# Patient Record
Sex: Female | Born: 1980 | Race: Black or African American | Hispanic: No | Smoking: Never smoker
Health system: Southern US, Community
[De-identification: ages and names within clinical notes are randomized; demographics above are authoritative.]

## PROBLEM LIST (undated history)

## (undated) ENCOUNTER — Inpatient Hospital Stay (HOSPITAL_COMMUNITY): Payer: Self-pay

## (undated) DIAGNOSIS — I2699 Other pulmonary embolism without acute cor pulmonale: Secondary | ICD-10-CM

## (undated) DIAGNOSIS — D573 Sickle-cell trait: Secondary | ICD-10-CM

## (undated) DIAGNOSIS — D649 Anemia, unspecified: Secondary | ICD-10-CM

## (undated) DIAGNOSIS — J45909 Unspecified asthma, uncomplicated: Secondary | ICD-10-CM

## (undated) HISTORY — PX: NO PAST SURGERIES: SHX2092

---

## 2005-08-27 ENCOUNTER — Emergency Department (HOSPITAL_COMMUNITY): Admission: EM | Admit: 2005-08-27 | Discharge: 2005-08-27 | Payer: Self-pay | Admitting: Emergency Medicine

## 2005-08-30 ENCOUNTER — Emergency Department (HOSPITAL_COMMUNITY): Admission: EM | Admit: 2005-08-30 | Discharge: 2005-08-30 | Payer: Self-pay | Admitting: Family Medicine

## 2006-02-18 ENCOUNTER — Emergency Department (HOSPITAL_COMMUNITY): Admission: EM | Admit: 2006-02-18 | Discharge: 2006-02-18 | Payer: Self-pay | Admitting: Emergency Medicine

## 2008-05-22 DIAGNOSIS — Z86718 Personal history of other venous thrombosis and embolism: Secondary | ICD-10-CM | POA: Insufficient documentation

## 2009-10-15 ENCOUNTER — Emergency Department (HOSPITAL_COMMUNITY): Admission: EM | Admit: 2009-10-15 | Discharge: 2009-10-15 | Payer: Self-pay | Admitting: Emergency Medicine

## 2010-08-08 LAB — D-DIMER, QUANTITATIVE: D-Dimer, Quant: 0.31 ug/mL-FEU (ref 0.00–0.48)

## 2010-08-08 LAB — DIFFERENTIAL
Basophils Absolute: 0 10*3/uL (ref 0.0–0.1)
Basophils Relative: 0 % (ref 0–1)
Eosinophils Absolute: 0.1 10*3/uL (ref 0.0–0.7)
Eosinophils Relative: 2 % (ref 0–5)
Neutro Abs: 3.8 10*3/uL (ref 1.7–7.7)

## 2010-08-08 LAB — PROTIME-INR
INR: 1.13 (ref 0.00–1.49)
Prothrombin Time: 14.4 seconds (ref 11.6–15.2)

## 2010-08-08 LAB — BASIC METABOLIC PANEL
BUN: 10 mg/dL (ref 6–23)
CO2: 25 mEq/L (ref 19–32)
Creatinine, Ser: 0.69 mg/dL (ref 0.4–1.2)
GFR calc Af Amer: >60 mL/min (ref >60)
Glucose, Bld: 99 mg/dL (ref 70–99)
Sodium: 139 mEq/L (ref 135–145)

## 2010-08-08 LAB — CBC
HCT: 38.4 % (ref 36.0–46.0)
MCV: 93.6 fL (ref 78.0–100.0)
WBC: 6.2 10*3/uL (ref 4.0–10.5)

## 2010-10-01 ENCOUNTER — Emergency Department (HOSPITAL_COMMUNITY): Payer: Self-pay

## 2010-10-01 ENCOUNTER — Emergency Department (HOSPITAL_COMMUNITY)
Admission: EM | Admit: 2010-10-01 | Discharge: 2010-10-02 | Disposition: A | Discharge status: Home / Self Care | Payer: Self-pay | Attending: Emergency Medicine | Admitting: Emergency Medicine

## 2010-10-01 DIAGNOSIS — R079 Chest pain, unspecified: Secondary | ICD-10-CM | POA: Insufficient documentation

## 2010-10-01 DIAGNOSIS — R0602 Shortness of breath: Secondary | ICD-10-CM | POA: Insufficient documentation

## 2010-10-01 DIAGNOSIS — M546 Pain in thoracic spine: Secondary | ICD-10-CM | POA: Insufficient documentation

## 2010-10-01 DIAGNOSIS — Z86718 Personal history of other venous thrombosis and embolism: Secondary | ICD-10-CM | POA: Insufficient documentation

## 2010-10-01 HISTORY — DX: Other pulmonary embolism without acute cor pulmonale: I26.99

## 2010-10-01 LAB — DIFFERENTIAL
Basophils Absolute: 0 10*3/uL (ref 0.0–0.1)
Lymphocytes Relative: 32 % (ref 12–46)
Monocytes Absolute: 0.8 10*3/uL (ref 0.1–1.0)
Neutro Abs: 3.7 10*3/uL (ref 1.7–7.7)

## 2010-10-01 LAB — POCT I-STAT, CHEM 8
BUN: 12 mg/dL (ref 6–23)
Calcium, Ion: 1.11 mmol/L — ABNORMAL LOW (ref 1.12–1.32)
HCT: 38 % (ref 36.0–46.0)
Potassium: 3.6 mEq/L (ref 3.5–5.1)
Sodium: 141 mEq/L (ref 135–145)

## 2010-10-01 LAB — CBC
HCT: 35.7 % — ABNORMAL LOW (ref 36.0–46.0)
MCH: 31.1 pg (ref 26.0–34.0)
MCHC: 34.5 g/dL (ref 30.0–36.0)
MCV: 90.4 fL (ref 78.0–100.0)
RBC: 3.95 MIL/uL (ref 3.87–5.11)
WBC: 7.1 10*3/uL (ref 4.0–10.5)

## 2010-10-02 ENCOUNTER — Encounter (HOSPITAL_COMMUNITY): Payer: Self-pay | Admitting: Radiology

## 2010-10-02 MED ORDER — IOHEXOL 300 MG/ML  SOLN
100.0000 mL | Freq: Once | INTRAMUSCULAR | Status: AC | PRN
  Administered 2010-10-02: 100 mL via INTRAVENOUS

## 2011-07-26 ENCOUNTER — Emergency Department (HOSPITAL_COMMUNITY)
Admission: EM | Admit: 2011-07-26 | Discharge: 2011-07-26 | Disposition: A | Discharge status: Home / Self Care | Payer: Self-pay | Attending: Emergency Medicine | Admitting: Emergency Medicine

## 2011-07-26 ENCOUNTER — Emergency Department (HOSPITAL_COMMUNITY): Payer: Self-pay

## 2011-07-26 ENCOUNTER — Encounter (HOSPITAL_COMMUNITY): Payer: Self-pay | Admitting: Emergency Medicine

## 2011-07-26 DIAGNOSIS — Z86711 Personal history of pulmonary embolism: Secondary | ICD-10-CM | POA: Insufficient documentation

## 2011-07-26 DIAGNOSIS — R209 Unspecified disturbances of skin sensation: Secondary | ICD-10-CM | POA: Insufficient documentation

## 2011-07-26 DIAGNOSIS — M542 Cervicalgia: Secondary | ICD-10-CM | POA: Insufficient documentation

## 2011-07-26 LAB — POCT I-STAT, CHEM 8
Calcium, Ion: 1.21 mmol/L (ref 1.12–1.32)
Chloride: 109 mEq/L (ref 96–112)
Creatinine, Ser: 0.6 mg/dL (ref 0.50–1.10)
HCT: 40 % (ref 36.0–46.0)
Potassium: 3.8 mEq/L (ref 3.5–5.1)
TCO2: 23 mmol/L (ref 0–100)

## 2011-07-26 MED ORDER — IBUPROFEN 600 MG PO TABS: 600.0000 mg | ORAL_TABLET | Freq: Four times a day (QID) | ORAL | Status: AC | PRN

## 2011-07-26 MED ORDER — DIAZEPAM 5 MG PO TABS: 5.0000 mg | ORAL_TABLET | Freq: Four times a day (QID) | ORAL | Status: AC | PRN

## 2011-07-26 NOTE — Discharge Instructions (Signed)
Called a neurologist to schedule an appointment to be seen. Return here at once for inability to move your arms, severe pain, or any other problems. Take medications as directed

## 2011-07-26 NOTE — ED Provider Notes (Signed)
History     CSN: [id number]  Arrival date & time 07/26/11  1826   First MD Initiated Contact with Patient 07/26/11 1915      Chief Complaint  Patient presents with  . Numbness  . Neck Pain    (Consider location/radiation/quality/duration/timing/severity/associated sxs/prior treatment) Patient is a 31 y.o. female presenting with neck pain. The history is provided by the patient.  Neck Pain    patient presents with numbness in the back of her head radiating down both shoulders into her arms for one month. Symptoms are not associated with blurred vision, slurred speech. Nothing seems to make her symptoms better or worse. No dizziness or neck injury. No medications taken for this prior to arrival. She notes and paresthesias of the tips of both fingers. No prior evaluation for this. She does note a remote history of pulmonary embolism but denies any new dyspnea, chest pain, leg pain or swelling.  Past Medical History  Diagnosis Date  . PE (pulmonary embolism)     History reviewed. No pertinent past surgical history.  History reviewed. No pertinent family history.  History  Substance Use Topics  . Smoking status: Never Smoker   . Smokeless tobacco: Not on file  . Alcohol Use: No    OB History    Grav Para Term Preterm Abortions TAB SAB Ect Mult Living                  Review of Systems  HENT: Positive for neck pain.   All other systems reviewed and are negative.    Allergies  Review of patient's allergies indicates no known allergies.  Home Medications  No current outpatient prescriptions on file.  BP 123/67  Pulse 89  Temp(Src) 98.5 F (36.9 C) (Oral)  Resp 16  SpO2 99%  LMP 07/26/2011  Physical Exam  Nursing note and vitals reviewed. Constitutional: She is oriented to person, place, and time. She appears well-developed and well-nourished.  Non-toxic appearance. No distress.  HENT:  Head: Normocephalic and atraumatic.  Eyes: Conjunctivae, EOM and lids are  normal. Pupils are equal, round, and reactive to light.  Neck: Normal range of motion. Neck supple. No tracheal deviation present. No mass present.  Cardiovascular: Normal rate, regular rhythm and normal heart sounds.  Exam reveals no gallop.   No murmur heard. Pulmonary/Chest: Effort normal and breath sounds normal. No stridor. No respiratory distress. She has no decreased breath sounds. She has no wheezes. She has no rhonchi. She has no rales.  Abdominal: Soft. Normal appearance and bowel sounds are normal. She exhibits no distension. There is no tenderness. There is no rebound and no CVA tenderness.  Musculoskeletal: Normal range of motion. She exhibits no edema and no tenderness.  Neurological: She is alert and oriented to person, place, and time. She has normal strength. No cranial nerve deficit or sensory deficit. GCS eye subscore is 4. GCS verbal subscore is 5. GCS motor subscore is 6.  Skin: Skin is warm and dry. No abrasion and no rash noted.  Psychiatric: She has a normal mood and affect. Her speech is normal and behavior is normal.    ED Course  Procedures (including critical care time)   Labs Reviewed  POCT I-STAT, CHEM 8   No results found.   No diagnosis found.    MDM  Patient's CT results reviewed. Patient placed on anti-inflammatories muscle relaxants and will be given referral to neurology. No signs of acute neurological emergency noted  Toy Baker, MD 07/26/11 2231

## 2011-07-26 NOTE — ED Provider Notes (Signed)
Pt reports posterior headache, numbness, "tingling in hands" and blurred vision in the morning Reports h/o PE and requiring coumadin in past No focal neuro deficits noted on screening exam  Joya Gaskins, MD 07/26/11 1921

## 2011-07-26 NOTE — ED Notes (Addendum)
Patient complaining of intermittent numbness and pain radiating from the back of her head down into her shoulders for the past month. Denies dizziness and vision changes. Denies injury to neck.

## 2013-03-19 ENCOUNTER — Other Ambulatory Visit: Payer: Self-pay

## 2013-03-19 ENCOUNTER — Emergency Department (HOSPITAL_COMMUNITY)
Admission: EM | Admit: 2013-03-19 | Discharge: 2013-03-19 | Disposition: A | Discharge status: Home / Self Care | Payer: Medicaid Other | Attending: Emergency Medicine | Admitting: Emergency Medicine

## 2013-03-19 ENCOUNTER — Encounter (HOSPITAL_COMMUNITY): Payer: Self-pay | Admitting: Emergency Medicine

## 2013-03-19 DIAGNOSIS — J069 Acute upper respiratory infection, unspecified: Secondary | ICD-10-CM

## 2013-03-19 DIAGNOSIS — Z86718 Personal history of other venous thrombosis and embolism: Secondary | ICD-10-CM | POA: Insufficient documentation

## 2013-03-19 DIAGNOSIS — Z880 Allergy status to penicillin: Secondary | ICD-10-CM | POA: Insufficient documentation

## 2013-03-19 DIAGNOSIS — R0789 Other chest pain: Secondary | ICD-10-CM | POA: Insufficient documentation

## 2013-03-19 MED ORDER — PREDNISONE 20 MG PO TABS
60.0000 mg | ORAL_TABLET | Freq: Once | ORAL | Status: AC
  Administered 2013-03-19: 60 mg via ORAL
  Filled 2013-03-19: qty 3

## 2013-03-19 MED ORDER — ALBUTEROL SULFATE (5 MG/ML) 0.5% IN NEBU
2.5000 mg | INHALATION_SOLUTION | Freq: Once | RESPIRATORY_TRACT | Status: DC
  Filled 2013-03-19: qty 0.5

## 2013-03-19 MED ORDER — ALBUTEROL SULFATE (5 MG/ML) 0.5% IN NEBU
5.0000 mg | INHALATION_SOLUTION | Freq: Once | RESPIRATORY_TRACT | Status: AC
  Administered 2013-03-19: 5 mg via RESPIRATORY_TRACT

## 2013-03-19 MED ORDER — IPRATROPIUM BROMIDE 0.02 % IN SOLN
0.5000 mg | Freq: Once | RESPIRATORY_TRACT | Status: AC
  Administered 2013-03-19: 0.5 mg via RESPIRATORY_TRACT
  Filled 2013-03-19: qty 2.5

## 2013-03-19 MED ORDER — ALBUTEROL SULFATE HFA 108 (90 BASE) MCG/ACT IN AERS: 1.0000 | INHALATION_SPRAY | Freq: Four times a day (QID) | RESPIRATORY_TRACT | Status: DC | PRN

## 2013-03-19 MED ORDER — PREDNISONE 20 MG PO TABS: 60.0000 mg | ORAL_TABLET | Freq: Every day | ORAL | Status: DC

## 2013-03-19 NOTE — ED Notes (Signed)
Pt's  Pulse ox while ambulating is 95% --96% RA.

## 2013-03-19 NOTE — ED Notes (Signed)
Wheezing since lasy night and having chest pain took 2 asa today

## 2013-03-19 NOTE — ED Provider Notes (Signed)
CSN: [id number]     Arrival date & time 03/19/13  1106 History   First MD Initiated Contact with Patient 03/19/13 1119     Chief Complaint  Patient presents with  . Shortness of Breath   (Consider location/radiation/quality/duration/timing/severity/associated sxs/prior Treatment) HPI Comments: Patient presents today with a chief complaint of SOB, wheezing, cough, and chest pain.  She reports that her symptoms have been present for the past 3 days, but worsened last evening.  She states that she has never formally been diagnosed with Asthma, but has had episodes of wheezing and shortness of breath in the past.  She denies any fever or chills.  She denies hemoptysis.  She does report associated nasal congestion.  She has had a Pulmonary Embolism in the past.  She reports that her symptoms today do not feel similar to when she had a PE in the past.  She reports that the PE came after giving birth to her child four years ago.   She is currently not on any anticoagulants.  She denies lower extremity edema or pain.  Denies use of any systemic estrogen medications.  Denies prolonged travel or surgeries in the past 4 weeks.    The history is provided by the patient.    Past Medical History  Diagnosis Date  . PE (pulmonary embolism)    History reviewed. No pertinent past surgical history. No family history on file. History  Substance Use Topics  . Smoking status: Never Smoker   . Smokeless tobacco: Not on file  . Alcohol Use: No   OB History   Grav Para Term Preterm Abortions TAB SAB Ect Mult Living                 Review of Systems  Allergies  Penicillins  Home Medications  No current outpatient prescriptions on file. BP 110/65  Pulse 109  Temp(Src) 98.8 F (37.1 C) (Oral)  Resp 28  Ht 5\' 6"  (1.676 m)  Wt 163 lb (73.936 kg)  BMI 26.32 kg/m2  SpO2 98% Physical Exam  Nursing note and vitals reviewed. Constitutional: She appears well-developed and well-nourished. No distress.   HENT:  Head: Normocephalic and atraumatic.  Mouth/Throat: Oropharynx is clear and moist.  Neck: Normal range of motion. Neck supple.  Cardiovascular: Normal rate, regular rhythm, normal heart sounds and intact distal pulses.   Pulmonary/Chest: Effort normal. No respiratory distress. She has wheezes. She has no rales. She exhibits no tenderness.  Diffuse expiratory wheezing  Musculoskeletal: Normal range of motion.  No LE edema or erythema bilaterally  Neurological: She is alert.  Skin: Skin is warm and dry. She is not diaphoretic.  Psychiatric: She has a normal mood and affect.    ED Course  Procedures (including critical care time) Labs Review Labs Reviewed - No data to display Imaging Review No results found.  EKG Interpretation   None      Patient states that she has had significant improvement in symptoms after Albuterol.  Reports that the chest pain and SOB has improved after Albuterol.  2:01 PM Patient denies any SOB or chest pain at this time.  Patient ambulated with pulse ox 95-96 on RA.  She denies any SOB with walking.  Patient discussed with Dr. Oletta Lamas.   MDM  No diagnosis found. Patient presenting with a chief complaint of SOB and tightness in her chest.  On exam initially the patient was wheezing.  Patient given an Albuterol breathing treatment and symptoms resolved.  Patient ambulated and  maintained an oxygen sat of 95-96.  Patient did not have any chest pain or SOB after given the Albuterol.  Therefore, feel that symptoms are most consistent with an Asthma Exacerbation.  Do not feel that symptoms are consistent with a Pulmonary Embolism.  Patient is stable for discharge.  Strict return precautions given.    Santiago Glad, PA-C 03/22/13 [phone]

## 2013-03-19 NOTE — ED Provider Notes (Signed)
Medical screening examination/treatment/procedure(s) were conducted as a shared visit with non-physician practitioner(s) and myself.  I personally evaluated the patient during the encounter.  EKG Interpretation   ECG at time 11:11 shows sinus tachycardia at rate 112, biatrial enlargement, normal axis, non specific anterior t wave flattening.  Borderline ECG.  T wave flattening is new compared to ECG from 10/01/10.         Gavin Pound. Jaiden Dinkins, MD 03/19/13 1452

## 2013-03-25 NOTE — ED Provider Notes (Signed)
Medical screening examination/treatment/procedure(s) were performed by non-physician practitioner and as supervising physician I was immediately available for consultation/collaboration.  EKG Interpretation   None         Gavin Pound. Auriana Scalia, MD 03/25/13 1126

## 2013-06-10 ENCOUNTER — Inpatient Hospital Stay (HOSPITAL_COMMUNITY)
Admission: AD | Admit: 2013-06-10 | Discharge: 2013-06-10 | Disposition: A | Discharge status: Home / Self Care | Payer: Self-pay | Source: Ambulatory Visit | Attending: Obstetrics & Gynecology | Admitting: Obstetrics & Gynecology

## 2013-06-10 ENCOUNTER — Encounter (HOSPITAL_COMMUNITY): Payer: Self-pay | Admitting: *Deleted

## 2013-06-10 DIAGNOSIS — L03317 Cellulitis of buttock: Principal | ICD-10-CM

## 2013-06-10 DIAGNOSIS — L0231 Cutaneous abscess of buttock: Secondary | ICD-10-CM | POA: Insufficient documentation

## 2013-06-10 LAB — POCT PREGNANCY, URINE: Preg Test, Ur: NEGATIVE

## 2013-06-10 MED ORDER — HYDROCODONE-ACETAMINOPHEN 5-325 MG PO TABS: 1.0000 | ORAL_TABLET | Freq: Four times a day (QID) | ORAL | Status: DC | PRN

## 2013-06-10 MED ORDER — SULFAMETHOXAZOLE-TRIMETHOPRIM 800-160 MG PO TABS: 1.0000 | ORAL_TABLET | Freq: Two times a day (BID) | ORAL | Status: DC

## 2013-06-10 NOTE — Discharge Instructions (Signed)
Abscess °An abscess (boil or furuncle) is an infected area on or under the skin. This area is filled with yellowish-white fluid (pus) and other material (debris). °HOME CARE  °· Only take medicines as told by your doctor. °· If you were given antibiotic medicine, take it as directed. Finish the medicine even if you start to feel better. °· If gauze is used, follow your doctor's directions for changing the gauze. °· To avoid spreading the infection: °· Keep your abscess covered with a bandage. °· Wash your hands well. °· Do not share personal care items, towels, or whirlpools with others. °· Avoid skin contact with others. °· Keep your skin and clothes clean around the abscess. °· Keep all doctor visits as told. °GET HELP RIGHT AWAY IF:  °· You have more pain, puffiness (swelling), or redness in the wound site. °· You have more fluid or blood coming from the wound site. °· You have muscle aches, chills, or you feel sick. °· You have a fever. °MAKE SURE YOU:  °· Understand these instructions. °· Will watch your condition. °· Will get help right away if you are not doing well or get worse. °Document Released: 10/25/2007 Document Revised: 11/07/2011 Document Reviewed: 07/21/2011 °ExitCare® Patient Information ©2014 [employer]. ° °Abscess °Care After °An abscess (also called a boil or furuncle) is an infected area that contains a collection of pus. Signs and symptoms of an abscess include pain, tenderness, redness, or hardness, or you may feel a moveable soft area under your skin. An abscess can occur anywhere in the body. The infection may spread to surrounding tissues causing cellulitis. A cut (incision) by the surgeon was made over your abscess and the pus was drained out. Gauze may have been packed into the space to provide a drain that will allow the cavity to heal from the inside outwards. The boil may be painful for 5 to 7 days. Most people with a boil do not have high fevers. Your abscess, if seen early, may  not have localized, and may not have been lanced. If not, another appointment may be required for this if it does not get better on its own or with medications. °HOME CARE INSTRUCTIONS  °· Only take over-the-counter or prescription medicines for pain, discomfort, or fever as directed by your caregiver. °· When you bathe, soak and then remove gauze or iodoform packs at least daily or as directed by your caregiver. You may then wash the wound gently with mild soapy water. Repack with gauze or do as your caregiver directs. °SEEK IMMEDIATE MEDICAL CARE IF:  °· You develop increased pain, swelling, redness, drainage, or bleeding in the wound site. °· You develop signs of generalized infection including muscle aches, chills, fever, or a general ill feeling. °· An oral temperature above 102° F (38.9° C) develops, not controlled by medication. °See your caregiver for a recheck if you develop any of the symptoms described above. If medications (antibiotics) were prescribed, take them as directed. °Document Released: 11/24/2004 Document Revised: 07/31/2011 Document Reviewed: 07/22/2007 °ExitCare® Patient Information ©2014 [employer]. ° °

## 2013-06-10 NOTE — MAU Provider Note (Signed)
Attestation of Attending Supervision of Advanced Practitioner (CNM/NP): Evaluation and management procedures were performed by the Advanced Practitioner under my supervision and collaboration.  I have reviewed the Advanced Practitioner's note and chart, and I agree with the management and plan.  HARRAWAY-SMITH, Viaan Knippenberg 10:02 PM     

## 2013-06-10 NOTE — MAU Provider Note (Signed)
CSN: [id number]     Arrival date & time 06/10/13  1521 History   None    Chief Complaint  Patient presents with  . Abscess   (Consider location/radiation/quality/duration/timing/severity/associated sxs/prior Treatment) Patient is a 33 y.o. female presenting with abscess. The history is provided by the patient.  Abscess  This is a new problem. The current episode started less than one week ago. The problem occurs continuously. The problem has been gradually worsening. The abscess is present on the right buttock. The problem is moderate. The abscess is characterized by redness, painfulness and swelling.  Alicia Cardenas is a 33 y.o. female who presents to the MAU with an abscess to the right buttock that started 4 days ago. Today the area started draining. Patient complains of moderate pain and swelling to the area. She has not had an abscess in the past. She denies nausea, vomiting, fever or chills. She does have swelling in the right inguinal area and tenderness.  Past medical history significant for  PE.   Past Medical History  Diagnosis Date  . PE (pulmonary embolism)    Past Surgical History  Procedure Laterality Date  . No past surgeries     Family History  Problem Relation Age of Onset  . Diabetes Maternal Uncle   . Heart disease Maternal Uncle   . Hypertension Maternal Uncle   . Hearing loss Neg Hx    History  Substance Use Topics  . Smoking status: Never Smoker   . Smokeless tobacco: Never Used  . Alcohol Use: No   OB History   Grav Para Term Preterm Abortions TAB SAB Ect Mult Living   2 2 2  0 0 0 0 0 0 2     Review of Systems Negative except as stated in HPI  Allergies  Penicillins  Home Medications   Current Outpatient Rx  Name  Route  Sig  Dispense  Refill  . HYDROcodone-acetaminophen (NORCO) 5-325 MG per tablet   Oral   Take 1 tablet by mouth every 6 (six) hours as needed for moderate pain.   15 tablet   0   . sulfamethoxazole-trimethoprim (SEPTRA DS)  800-160 MG per tablet   Oral   Take 1 tablet by mouth every 12 (twelve) hours.   14 tablet   0    BP 110/59  Pulse 72  Temp(Src) 98.4 F (36.9 C) (Oral)  Resp 20  Ht 5\' 8"  (1.727 m)  Wt 167 lb 6.4 oz (75.932 kg)  BMI 25.46 kg/m2  SpO2 100% Physical Exam  Nursing note and vitals reviewed. Constitutional: She is oriented to person, place, and time. She appears well-developed and well-nourished.  HENT:  Head: Normocephalic.  Eyes: Conjunctivae and EOM are normal.  Neck: Normal range of motion. Neck supple.  Cardiovascular: Normal rate.   Pulmonary/Chest: Effort normal.  Abdominal: Soft. There is no tenderness.  Genitourinary:     Draining abscess to the right buttock, purulent drainage, large amount. Tender swollen inguinal nodes on the right. This does not appear to extend to the rectum.   Musculoskeletal: Normal range of motion.  Lymphadenopathy:       Right: Inguinal adenopathy present.  Neurological: She is alert and oriented to person, place, and time. No cranial nerve deficit.  Skin: Skin is warm and dry.  Psychiatric: She has a normal mood and affect. Her behavior is normal.    ED Course  Procedures    MDM  33 y.o. female with draining abscess to the right buttock.  Stable for discharge without further screening. Patient to follow up with [facility]. She will use sitz baths and take antibiotics and pain medication. She will return here as needed. Discussed with the patient and all questioned fully answered.    Medication List         HYDROcodone-acetaminophen 5-325 MG per tablet  Commonly known as:  NORCO  Take 1 tablet by mouth every 6 (six) hours as needed for moderate pain.     sulfamethoxazole-trimethoprim 800-160 MG per tablet  Commonly known as:  SEPTRA DS  Take 1 tablet by mouth every 12 (twelve) hours.

## 2013-06-10 NOTE — MAU Note (Signed)
Abscess on rt buttock, first noted 3-4 days ago. Started draining today. Has had them previously.

## 2013-06-10 NOTE — MAU Note (Signed)
Patient states she has an area around the anus that she feels is an abscess. Has been getting bigger and more painful.

## 2013-11-10 ENCOUNTER — Inpatient Hospital Stay (HOSPITAL_COMMUNITY)
Admission: AD | Admit: 2013-11-10 | Discharge: 2013-11-10 | Disposition: A | Discharge status: Home / Self Care | Payer: Medicaid Other | Source: Ambulatory Visit | Attending: Obstetrics & Gynecology | Admitting: Obstetrics & Gynecology

## 2013-11-10 ENCOUNTER — Inpatient Hospital Stay (HOSPITAL_COMMUNITY): Payer: Medicaid Other

## 2013-11-10 ENCOUNTER — Encounter (HOSPITAL_COMMUNITY): Payer: Self-pay | Admitting: *Deleted

## 2013-11-10 DIAGNOSIS — O208 Other hemorrhage in early pregnancy: Secondary | ICD-10-CM | POA: Diagnosis present

## 2013-11-10 DIAGNOSIS — B9689 Other specified bacterial agents as the cause of diseases classified elsewhere: Secondary | ICD-10-CM | POA: Diagnosis not present

## 2013-11-10 DIAGNOSIS — A499 Bacterial infection, unspecified: Secondary | ICD-10-CM | POA: Diagnosis not present

## 2013-11-10 DIAGNOSIS — O43899 Other placental disorders, unspecified trimester: Secondary | ICD-10-CM

## 2013-11-10 DIAGNOSIS — N760 Acute vaginitis: Secondary | ICD-10-CM | POA: Diagnosis not present

## 2013-11-10 DIAGNOSIS — O2390 Unspecified genitourinary tract infection in pregnancy, unspecified trimester: Secondary | ICD-10-CM | POA: Diagnosis not present

## 2013-11-10 DIAGNOSIS — Z86711 Personal history of pulmonary embolism: Secondary | ICD-10-CM | POA: Diagnosis not present

## 2013-11-10 DIAGNOSIS — O468X1 Other antepartum hemorrhage, first trimester: Secondary | ICD-10-CM

## 2013-11-10 DIAGNOSIS — O418X10 Other specified disorders of amniotic fluid and membranes, first trimester, not applicable or unspecified: Secondary | ICD-10-CM

## 2013-11-10 LAB — URINALYSIS, ROUTINE W REFLEX MICROSCOPIC
Bilirubin Urine: NEGATIVE
Glucose, UA: NEGATIVE mg/dL
Hgb urine dipstick: NEGATIVE
Ketones, ur: 15 mg/dL — AB
LEUKOCYTES UA: NEGATIVE
Nitrite: NEGATIVE
PH: 8 (ref 5.0–8.0)
Protein, ur: NEGATIVE mg/dL
SPECIFIC GRAVITY, URINE: 1.015 (ref 1.005–1.030)
Urobilinogen, UA: >8 mg/dL — ABNORMAL HIGH (ref 0.0–1.0)

## 2013-11-10 LAB — WET PREP, GENITAL
TRICH WET PREP: NONE SEEN
YEAST WET PREP: NONE SEEN

## 2013-11-10 LAB — CBC
HCT: 37.3 % (ref 36.0–46.0)
Hemoglobin: 12.6 g/dL (ref 12.0–15.0)
MCH: 30.6 pg (ref 26.0–34.0)
MCHC: 33.8 g/dL (ref 30.0–36.0)
MCV: 90.5 fL (ref 78.0–100.0)
Platelets: 256 10*3/uL (ref 150–400)
RBC: 4.12 MIL/uL (ref 3.87–5.11)
RDW: 13.1 % (ref 11.5–15.5)
WBC: 6.9 10*3/uL (ref 4.0–10.5)

## 2013-11-10 LAB — POCT PREGNANCY, URINE: Preg Test, Ur: POSITIVE — AB

## 2013-11-10 LAB — HCG, QUANTITATIVE, PREGNANCY: hCG, Beta Chain, Quant, S: 6123 m[IU]/mL — ABNORMAL HIGH (ref <5)

## 2013-11-10 NOTE — MAU Note (Signed)
Patient states she went to [place] yesterday and rode a kiddie ride and hurt her lower abdomen. Has slight spotting this am.

## 2013-11-10 NOTE — MAU Provider Note (Signed)
None     Chief Complaint:  Abdominal Pain   Alicia Cardenas is  33 y.o. G3P2002 at [id number] presents complaining of Abdominal Pain    Patient states she went to [place] yesterday and rode a kiddie ride and hurt her lower abdomen. Has slight spotting this am.  She did not know she was pregnant until I told her.  She is getting married in September, and seems pleased, albeit shocked, about the pregnancy.   Obstetrical/Gynecological History: OB History   Grav Para Term Preterm Abortions TAB SAB Ect Mult Living   3 2 2  0 0 0 0 0 0 2     Past Medical History: Past Medical History  Diagnosis Date  . PE (pulmonary embolism)     Past Surgical History: Past Surgical History  Procedure Laterality Date  . No past surgeries      Family History: Family History  Problem Relation Age of Onset  . Diabetes Maternal Uncle   . Heart disease Maternal Uncle   . Hypertension Maternal Uncle   . Hearing loss Neg Hx     Social History: History  Substance Use Topics  . Smoking status: Never Smoker   . Smokeless tobacco: Never Used  . Alcohol Use: No    Allergies:  Allergies  Allergen Reactions  . Penicillins Other (See Comments)    Childhood     Meds:  Prescriptions prior to admission  Medication Sig Dispense Refill  . albuterol (PROVENTIL HFA;VENTOLIN HFA) 108 (90 BASE) MCG/ACT inhaler Inhale 1 puff into the lungs every 6 (six) hours as needed for wheezing or shortness of breath.        .      Physical Exam  Blood pressure 115/58, pulse 82, resp. rate 16, height 5\' 8"  (1.727 m), weight 78.109 kg (172 lb 3.2 oz), last menstrual period 10/07/2013, SpO2 99.00%. GENERAL: Well-developed, well-nourished female in no acute distress.  LUNGS: Clear to auscultation bilaterally.  HEART: Regular rate and rhythm. ABDOMEN: Soft, nontender, nondistended, gravid.  EXTREMITIES: Nontender, no edema, 2+ distal pulses. DTR's 2+ PELVIC:  SSE: normal appearing d/c, no blood. Cx closed,  nonfriable. Wet prep and cultures taken.  Labs: Results for orders placed during the hospital encounter of 11/10/13 (from the past 24 hour(s))  URINALYSIS, ROUTINE W REFLEX MICROSCOPIC   Collection Time    11/10/13  6:35 PM      Result Value Ref Range   Color, Urine YELLOW  YELLOW   APPearance CLEAR  CLEAR   Specific Gravity, Urine 1.015  1.005 - 1.030   pH 8.0  5.0 - 8.0   Glucose, UA NEGATIVE  NEGATIVE mg/dL   Hgb urine dipstick NEGATIVE  NEGATIVE   Bilirubin Urine NEGATIVE  NEGATIVE   Ketones, ur 15 (*) NEGATIVE mg/dL   Protein, ur NEGATIVE  NEGATIVE mg/dL   Urobilinogen, UA >0.9>8.0 (*) 0.0 - 1.0 mg/dL   Nitrite NEGATIVE  NEGATIVE   Leukocytes, UA NEGATIVE  NEGATIVE  POCT PREGNANCY, URINE   Collection Time    11/10/13  6:43 PM      Result Value Ref Range   Preg Test, Ur POSITIVE (*) NEGATIVE  CBC   Collection Time    11/10/13  7:17 PM      Result Value Ref Range   WBC 6.9  4.0 - 10.5 K/uL   RBC 4.12  3.87 - 5.11 MIL/uL   Hemoglobin 12.6  12.0 - 15.0 g/dL   HCT 81.137.3  91.436.0 - 78.246.0 %   MCV  90.5  78.0 - 100.0 fL   MCH 30.6  26.0 - 34.0 pg   MCHC 33.8  30.0 - 36.0 g/dL   RDW 40.913.1  81.111.5 - 91.415.5 %   Platelets 256  150 - 400 K/uL    CLINICAL DATA:  Positive pregnancy test with vaginal spotting and pelvic pain.   EXAM: OBSTETRIC <14 WK US AND TRANSVAGINAL OB US   TECHNIQUE: Both transabdominal and transvaginal ultrasound examinations were performed for complete evaluation of the gestation as well as the maternal uterus, adnexal regions, and pelvic cul-de-sac. Transvaginal technique was performed to assess early pregnancy.   COMPARISON:  None.   FINDINGS: Intrauterine gestational sac: Visualized.  Single.   Yolk sac:  Visualized   Embryo:  Not visualized   Cardiac Activity: N/A   MSD:  0.76  mm   5 w   3  d   US EDC: 07/10/2014   Maternal uterus/adnexae: Tiny subchorionic hemorrhage identified. 3.9 cm simple cyst in the maternal right ovary is compatible with  a corpus luteum.   IMPRESSION: Single intrauterine gestational sac with yolk sec visualized but no embryo yet evident.    Assessment:  Early intrauterine gestational sac, probably too early to see embryo; small [facility]  Asymptomatic BV  Plan:  Pt to f/u with us if develops pain/bleeding.  Otherwise, f/u with OB/GYN of choice withing 2 weeks  Treat BV if becomes symptomatic or out of 1st trimester  CRESENZO-DISHMAN,FRANCES 6/22/20157:46 PM  '

## 2013-11-10 NOTE — Discharge Instructions (Signed)
Vaginal Bleeding During Pregnancy, First Trimester  A small amount of bleeding (spotting) from the vagina is relatively common in early pregnancy. It usually stops on its own. Various things may cause bleeding or spotting in early pregnancy. Some bleeding may be related to the pregnancy, and some may not. In most cases, the bleeding is normal and is not a problem. However, bleeding can also be a sign of something serious. Be sure to tell your health care provider about any vaginal bleeding right away.  Some possible causes of vaginal bleeding during the first trimester include:  · Infection or inflammation of the cervix.  · Growths (polyps) on the cervix.  · Miscarriage or threatened miscarriage.  · Pregnancy tissue has developed outside of the uterus and in a fallopian tube (tubal pregnancy).  · Tiny cysts have developed in the uterus instead of pregnancy tissue (molar pregnancy).  HOME CARE INSTRUCTIONS   Watch your condition for any changes. The following actions may help to lessen any discomfort you are feeling:  · Follow your health care provider's instructions for limiting your activity. If your health care provider orders bed rest, you may need to stay in bed and only get up to use the bathroom. However, your health care provider may allow you to continue light activity.  · If needed, make plans for someone to help with your regular activities and responsibilities while you are on bed rest.  · Keep track of the number of pads you use each day, how often you change pads, and how soaked (saturated) they are. Write this down.  · Do not use tampons. Do not douche.  · Do not have sexual intercourse or orgasms until approved by your health care provider.  · If you pass any tissue from your vagina, save the tissue so you can show it to your health care provider.  · Only take over-the-counter or prescription medicines as directed by your health care provider.  · Do not take aspirin because it can make you  bleed.  · Keep all follow-up appointments as directed by your health care provider.  SEEK MEDICAL CARE IF:  · You have any vaginal bleeding during any part of your pregnancy.  · You have cramps or labor pains.  · You have a fever, not controlled by medicine.  SEEK IMMEDIATE MEDICAL CARE IF:   · You have severe cramps in your back or belly (abdomen).  · You pass large clots or tissue from your vagina.  · Your bleeding increases.  · You feel light-headed or weak, or you have fainting episodes.  · You have chills.  · You are leaking fluid or have a gush of fluid from your vagina.  · You pass out while having a bowel movement.  MAKE SURE YOU:  · Understand these instructions.  · Will watch your condition.  · Will get help right away if you are not doing well or get worse.  Document Released: 02/15/2005 Document Revised: 05/13/2013 Document Reviewed: 01/13/2013  ExitCare® Patient Information ©2015 [employer]. This information is not intended to replace advice given to you by your health care provider. Make sure you discuss any questions you have with your health care provider.

## 2013-11-11 LAB — GC/CHLAMYDIA PROBE AMP
CT Probe RNA: NEGATIVE
GC Probe RNA: NEGATIVE

## 2013-11-11 LAB — ABO/RH: ABO/RH(D): A POS

## 2013-11-13 NOTE — MAU Provider Note (Signed)
Attestation of Attending Supervision of Advanced Practitioner (CNM/NP): Evaluation and management procedures were performed by the Advanced Practitioner under my supervision and collaboration. I have reviewed the Advanced Practitioner's note and chart, and I agree with the management and plan.  LEGGETT,KELLY H. 11:51 AM

## 2013-12-18 LAB — OB RESULTS CONSOLE GC/CHLAMYDIA
Chlamydia: NEGATIVE
Gonorrhea: NEGATIVE

## 2013-12-18 LAB — OB RESULTS CONSOLE RPR: RPR: NONREACTIVE

## 2013-12-18 LAB — OB RESULTS CONSOLE ANTIBODY SCREEN: ANTIBODY SCREEN: NEGATIVE

## 2013-12-18 LAB — OB RESULTS CONSOLE RUBELLA ANTIBODY, IGM: Rubella: IMMUNE

## 2013-12-18 LAB — OB RESULTS CONSOLE HEPATITIS B SURFACE ANTIGEN: Hepatitis B Surface Ag: NEGATIVE

## 2013-12-18 LAB — OB RESULTS CONSOLE HIV ANTIBODY (ROUTINE TESTING): HIV: NONREACTIVE

## 2013-12-23 DIAGNOSIS — Z8709 Personal history of other diseases of the respiratory system: Secondary | ICD-10-CM | POA: Insufficient documentation

## 2013-12-23 HISTORY — DX: Personal history of other diseases of the respiratory system: Z87.09

## 2013-12-30 ENCOUNTER — Ambulatory Visit (HOSPITAL_COMMUNITY)
Admission: RE | Admit: 2013-12-30 | Discharge: 2013-12-30 | Disposition: A | Discharge status: Home / Self Care | Payer: Medicaid Other | Source: Ambulatory Visit | Attending: Obstetrics and Gynecology | Admitting: Obstetrics and Gynecology

## 2013-12-30 DIAGNOSIS — Z86711 Personal history of pulmonary embolism: Secondary | ICD-10-CM | POA: Diagnosis not present

## 2013-12-30 DIAGNOSIS — O99891 Other specified diseases and conditions complicating pregnancy: Secondary | ICD-10-CM | POA: Diagnosis present

## 2013-12-30 DIAGNOSIS — Z8249 Family history of ischemic heart disease and other diseases of the circulatory system: Secondary | ICD-10-CM | POA: Diagnosis not present

## 2013-12-30 DIAGNOSIS — Z833 Family history of diabetes mellitus: Secondary | ICD-10-CM | POA: Insufficient documentation

## 2013-12-30 DIAGNOSIS — Z8759 Personal history of other complications of pregnancy, childbirth and the puerperium: Secondary | ICD-10-CM

## 2013-12-30 DIAGNOSIS — Z86718 Personal history of other venous thrombosis and embolism: Secondary | ICD-10-CM | POA: Diagnosis not present

## 2013-12-30 DIAGNOSIS — O9989 Other specified diseases and conditions complicating pregnancy, childbirth and the puerperium: Principal | ICD-10-CM

## 2013-12-30 NOTE — Consult Note (Signed)
Maternal Fetal Medicine Consultation  Requesting Provider(s): Osborn CohoAngela Roberts, MD  Reason for consultation: hx of DVT and PE post partum  HPI: Alicia Cardenas is a 33 yo G3P2002 currently at 12w 0d, EDD 07/14/2014 who was seen for consultation due to a personal history of PE and DVT.  The patient developed shortness of breath about 3 weeks after the delivery of her last child and was seen in the ED.  CT angiogram showed extensive pulmonary embolism bilaterally involving the right and left main pulmonary arteries and extending into the primary branches of the lower lobes bilaterally with probable pulmonary infarct of th right lower lobe.  She was also noted to have DVTs.  The patient underwent an extensive thrombophilia work up at that time that was unrevealing.  Protein C and Protein S were mildly decreased initially, but felt to be due to anticoagulation.  Repeat labs were within normal limits.  The patient was treated with Coumadin for 1 year - has been off anticoagulation since that time.  She is unaware of any family members with a history of thromboembolism. She is without complaints today.  Her previous term deliveries were otherwise without complications.  OB History: OB History   Grav Para Term Preterm Abortions TAB SAB Ect Mult Living   3 2 2  0 0 0 0 0 0 2      PMH:  Past Medical History  Diagnosis Date  . PE (pulmonary embolism)     PSH:  Past Surgical History  Procedure Laterality Date  . No past surgeries     Meds:  Current Outpatient Prescriptions on File Prior to Encounter  Medication Sig Dispense Refill  . albuterol (PROVENTIL HFA;VENTOLIN HFA) 108 (90 BASE) MCG/ACT inhaler Inhale 1 puff into the lungs every 6 (six) hours as needed for wheezing or shortness of breath.       No current facility-administered medications on file prior to encounter.   Allergies:  Allergies  Allergen Reactions  . Penicillins Other (See Comments)    Childhood    FH:  Family History   Problem Relation Age of Onset  . Diabetes Maternal Uncle   . Heart disease Maternal Uncle   . Hypertension Maternal Uncle   . Hearing loss Neg Hx    Soc:  Review of Systems: no vaginal bleeding or cramping/contractions, no LOF, no nausea/vomiting. All other systems reviewed and are negative.  PE:   Filed Vitals:   12/30/13 0919  BP: 116/69  Pulse: 102    A/P: 1) Single IUP at 6317w0d         2) Hx of extensive PEs - thrombophilia work up negative.  The patient was treated with Coumadin for 1 year - has not required long-term anticoagulation and except for this single episode, has not had any other history of thromboembolism.  Based on this information, would recommend initiating Lovenox 40 mg daily as a prophylactic dose.  I would continue this medication throughout the course of pregnancy and for 6 weeks post partum.  With a prophylactic dose of low-molecular weight heparin, conduction anesthesia (epidural / spinal) is not recommended for at least 12 hours after last dose (24-hr for full anticoagulation dose).  My standard practice is to continue the Lovenox throughout pregnancy and schedule and induction of labor at 39 weeks.  Would hold off on the dose of Lovenox on the morning of admission to better allow the opportunity for epidural anesthesia and resume the medications 12 hours after delivery.  In the event  that the patient goes into labor prior to 39 weeks, would ask to hold off on the dose should she start to develop symptoms of labor / frequent uterine contractions.  While a prophylactic dose of Lovenox should be adequate, given the size of the PEs, I would recommend a Hematology consultation.  If they feel that she should be on full dose anticoagulation, would go by their recommendations.   Thank you for the opportunity to be a part of the care of Alicia Cardenas. Please contact our office if we can be of further assistance.   I spent approximately 30 minutes with this patient with  over 50% of time spent in face-to-face counseling.   Alpha Gula, MD Maternal Fetal Medicine

## 2013-12-30 NOTE — ED Notes (Signed)
Prescription for Lovenox 40mg  [facility] [address].

## 2014-01-02 ENCOUNTER — Other Ambulatory Visit: Payer: Self-pay

## 2014-02-23 ENCOUNTER — Telehealth: Payer: Self-pay | Admitting: Oncology

## 2014-02-23 NOTE — Telephone Encounter (Signed)
C/D 02/23/14 for appt. 03/06/14

## 2014-03-05 ENCOUNTER — Telehealth: Payer: Self-pay | Admitting: Medical Oncology

## 2014-03-05 NOTE — Telephone Encounter (Signed)
LVMOM reminding patient of tomorrows appt and request for her to bring current list of medications. Patient to return call to office with questions/concerns.

## 2014-03-06 ENCOUNTER — Other Ambulatory Visit: Payer: Medicaid Other

## 2014-03-06 ENCOUNTER — Ambulatory Visit (HOSPITAL_BASED_OUTPATIENT_CLINIC_OR_DEPARTMENT_OTHER): Payer: Medicaid Other | Admitting: Oncology

## 2014-03-06 ENCOUNTER — Ambulatory Visit: Payer: Medicaid Other

## 2014-03-06 VITALS — BP 114/63 | HR 79 | Temp 97.9°F | Resp 18 | Ht 68.0 in | Wt 201.9 lb

## 2014-03-06 DIAGNOSIS — Z86718 Personal history of other venous thrombosis and embolism: Secondary | ICD-10-CM

## 2014-03-06 DIAGNOSIS — Z86711 Personal history of pulmonary embolism: Secondary | ICD-10-CM

## 2014-03-06 DIAGNOSIS — Z8759 Personal history of other complications of pregnancy, childbirth and the puerperium: Principal | ICD-10-CM

## 2014-03-06 NOTE — Consult Note (Signed)
Reason for Referral: DVT after pregnancy.   HPI: 33 year old woman [place] of [place] but currently lives in this area. She is a rather healthy woman without any significant comorbid conditions. She was diagnosed with asthma recently but otherwise healthy. She is currently pregnant with a third time and her expected delivery date is in February of 2016. Her first pregnancy close to 13 years ago was uncomplicated with normal delivery. Her second pregnancy in 2009 was also uncomplicated but developed postpartum pulmonary embolism involving both right and left pulmonary arteries. She recalls having to travel extensively and be mobilized extensively during that time. She was anticoagulated with warfarin for about a year and a half. She was evaluated by Maternal Fetal Medicine and recommended Lovenox prophylactic doses throughout her pregnancy and postpartum. She has not been taking Lovenox consistently for the last month. Her hypercoagulable workup has been unrevealing. Clinically, she is asymptomatic at this time. She had not had any blood clot prior or since. She did not have any family history of thrombosis to speak of. She did not have any history of miscarriages previously. She is not report any headaches or vision or syncope. She does report fatigue and tiredness associated with her current pregnancy. She does not report any chest pain, palpitation, leg edema. He does not report any cough but does report occasional wheezing. She does not report any shortness of breath or hemoptysis. She does not report any nausea, vomiting or constipation or diarrhea. She does not report any frequency urgency or hesitancy. She does not report any hematuria, hematochezia or epistaxis. She does not report lymphadenopathy. The rest of the review of systems unremarkable.   Past Medical History  Diagnosis Date  . PE (pulmonary embolism)   :  Past Surgical History  Procedure Laterality Date  . No past surgeries     :  Current Outpatient Prescriptions  Medication Sig Dispense Refill  . enoxaparin (LOVENOX) 40 MG/0.4ML injection Inject 40 mg into the skin daily.      Marland Kitchen. albuterol (PROVENTIL HFA;VENTOLIN HFA) 108 (90 BASE) MCG/ACT inhaler Inhale 1 puff into the lungs every 6 (six) hours as needed for wheezing or shortness of breath.       No current facility-administered medications for this visit.     Allergies  Allergen Reactions  . Penicillins Other (See Comments)    Childhood   :  Family History  Problem Relation Age of Onset  . Diabetes Maternal Uncle   . Heart disease Maternal Uncle   . Hypertension Maternal Uncle   . Hearing loss Neg Hx   :  History   Social History  . Marital Status: Single    Spouse Name: N/A    Number of Children: N/A  . Years of Education: N/A   Occupational History  . Not on file.   Social History Main Topics  . Smoking status: Never Smoker   . Smokeless tobacco: Never Used  . Alcohol Use: No  . Drug Use: No  . Sexual Activity: Yes    Birth Control/ Protection: None   Other Topics Concern  . Not on file   Social History Narrative  . No narrative on file  :  Pertinent items are noted in HPI.  Exam: Blood pressure 114/63, pulse 79, temperature 97.9 F (36.6 C), temperature source Oral, resp. rate 18, height 5\' 8"  (1.727 m), weight 201 lb 14.4 oz (91.581 kg), last menstrual period 10/07/2013. General appearance: alert and cooperative Head: Normocephalic, without obvious abnormality Throat:  lips, mucosa, and tongue normal; teeth and gums normal Neck: no adenopathy Back: negative Resp: clear to auscultation bilaterally Cardio: regular rate and rhythm, S1, S2 normal, no murmur, click, rub or gallop GI: soft, non-tender; bowel sounds normal; no masses,  no organomegaly Extremities: extremities normal, atraumatic, no cyanosis or edema Pulses: 2+ and symmetric Skin: Skin color, texture, turgor normal. No rashes or lesions Lymph nodes:  Cervical, supraclavicular, and axillary nodes normal.    Assessment and Plan:   33 year old woman who developed a postpartum DVT and extensive pulmonary embolism associated with her second pregnancy in 2009. She was anticoagulated for a year with warfarin and currently pregnant for the third time with estimated date of delivery in February of 2016. She was recommended to take Lovenox at 40 mg subcutaneously throughout her pregnancy and at least 6 weeks postpartum. The natural course of thrombophilia was discussed with the patient today. She did not have anything to suggest an inherited thrombophilia and she is likely developed her previous thrombosis because of the pregnancy state as well as immobility afterwards.  Recommendation standpoint, I agree with the recommendation by Dr. Claudean SeveranceWhitecar for a Maternal Fetal Medicine which includes low molecular weight heparin in the form of Lovenox prophylactic doses throughout her pregnancy and at least 6 weeks postpartum. I see no need for further subsequent ablation at this time and certainly should high-risk enough to justify Lovenox over aspirin.  I encouraged her to continue taking this medication at this time. I emphasized importance of adherence to this medication to improve her chances of not developing a thrombosis. I also encouraged her to ambulate as much as she can. I also discouraged her from long distance travel release for the immediate future.  All her questions are answered today to her satisfaction.

## 2014-03-06 NOTE — Progress Notes (Signed)
Please see consult note.  

## 2014-03-23 ENCOUNTER — Encounter (HOSPITAL_COMMUNITY): Payer: Self-pay | Admitting: *Deleted

## 2014-03-30 ENCOUNTER — Encounter (HOSPITAL_COMMUNITY): Payer: Self-pay

## 2014-03-30 ENCOUNTER — Inpatient Hospital Stay (HOSPITAL_COMMUNITY)
Admission: AD | Admit: 2014-03-30 | Discharge: 2014-03-30 | Disposition: A | Discharge status: Home / Self Care | Payer: Medicaid Other | Source: Ambulatory Visit | Attending: Obstetrics & Gynecology | Admitting: Obstetrics & Gynecology

## 2014-03-30 DIAGNOSIS — Z3A24 24 weeks gestation of pregnancy: Secondary | ICD-10-CM | POA: Insufficient documentation

## 2014-03-30 DIAGNOSIS — Z86718 Personal history of other venous thrombosis and embolism: Secondary | ICD-10-CM | POA: Diagnosis not present

## 2014-03-30 DIAGNOSIS — Z880 Allergy status to penicillin: Secondary | ICD-10-CM

## 2014-03-30 DIAGNOSIS — J45909 Unspecified asthma, uncomplicated: Secondary | ICD-10-CM

## 2014-03-30 DIAGNOSIS — R102 Pelvic and perineal pain: Secondary | ICD-10-CM | POA: Insufficient documentation

## 2014-03-30 DIAGNOSIS — Z7901 Long term (current) use of anticoagulants: Secondary | ICD-10-CM | POA: Insufficient documentation

## 2014-03-30 DIAGNOSIS — O9989 Other specified diseases and conditions complicating pregnancy, childbirth and the puerperium: Secondary | ICD-10-CM

## 2014-03-30 DIAGNOSIS — M7918 Myalgia, other site: Secondary | ICD-10-CM

## 2014-03-30 DIAGNOSIS — O99891 Other specified diseases and conditions complicating pregnancy: Secondary | ICD-10-CM

## 2014-03-30 DIAGNOSIS — O4690 Antepartum hemorrhage, unspecified, unspecified trimester: Secondary | ICD-10-CM | POA: Diagnosis present

## 2014-03-30 DIAGNOSIS — O26892 Other specified pregnancy related conditions, second trimester: Secondary | ICD-10-CM | POA: Diagnosis present

## 2014-03-30 HISTORY — DX: Anemia, unspecified: D64.9

## 2014-03-30 HISTORY — DX: Sickle-cell trait: D57.3

## 2014-03-30 HISTORY — DX: Unspecified asthma, uncomplicated: J45.909

## 2014-03-30 LAB — URINALYSIS, ROUTINE W REFLEX MICROSCOPIC
BILIRUBIN URINE: NEGATIVE
Glucose, UA: NEGATIVE mg/dL
Hgb urine dipstick: NEGATIVE
Ketones, ur: NEGATIVE mg/dL
NITRITE: NEGATIVE
PH: 6 (ref 5.0–8.0)
Protein, ur: NEGATIVE mg/dL
Specific Gravity, Urine: >1.03 — ABNORMAL HIGH (ref 1.005–1.030)
Urobilinogen, UA: 1 mg/dL (ref 0.0–1.0)

## 2014-03-30 LAB — WET PREP, GENITAL
Trich, Wet Prep: NONE SEEN
YEAST WET PREP: NONE SEEN

## 2014-03-30 LAB — URINE MICROSCOPIC-ADD ON

## 2014-03-30 MED ORDER — CYCLOBENZAPRINE HCL 10 MG PO TABS: ORAL_TABLET | ORAL | Status: DC

## 2014-03-30 MED ORDER — METRONIDAZOLE 500 MG PO TABS: 500.0000 mg | ORAL_TABLET | Freq: Two times a day (BID) | ORAL | Status: AC

## 2014-03-30 NOTE — MAU Note (Signed)
Pt states no recent intercourse, did see light spotting, however has cleared up now. Has sharp pain across lower abdomen when standing for long periods of time. Denies burning or urgency with voiding.

## 2014-03-30 NOTE — MAU Provider Note (Signed)
History   33 yo G3P2002 at 24 6/7 weeks presented unannounced c/o pelvic pressure and pain, pink spotting on Friday, none since.  Denies recent IC, no organized contractions, "just feeling crampy".  Has had this pain "since I began showing".  Works in a call center, sitting for 8 hours, with difficulty getting up and down.  On Lovenox 40 mg SQ daily due to hx of DVT/PE after 2010 pregnancy.  Seen by Dr. Claudean SeveranceWhitecar, MFM, and Dr. Clelia CroftShadad (Hematology) as consult for plan of care for anti-coagulation.  Patient Active Problem List   Diagnosis Date Noted  . Vaginal bleeding during pregnancy, antepartum 03/30/2014  . Asthma, chronic 03/30/2014  . History of pulmonary embolus during pregnancy 12/30/2013    Chief Complaint  Patient presents with  . Abdominal Cramping  . Vaginal Bleeding   HPI:  As above  OB History    Gravida Para Term Preterm AB TAB SAB Ectopic Multiple Living   3 2 2  0 0 0 0 0 0 2      Past Medical History  Diagnosis Date  . PE (pulmonary embolism)   . Sickle cell trait   . Anemia     Past Surgical History  Procedure Laterality Date  . No past surgeries      Family History  Problem Relation Age of Onset  . Diabetes Maternal Uncle   . Heart disease Maternal Uncle   . Hypertension Maternal Uncle   . Hearing loss Neg Hx     History  Substance Use Topics  . Smoking status: Never Smoker   . Smokeless tobacco: Never Used  . Alcohol Use: No    Allergies:  Allergies  Allergen Reactions  . Penicillins Other (See Comments)    Childhood     Prescriptions prior to admission  Medication Sig Dispense Refill Last Dose  . albuterol (PROVENTIL HFA;VENTOLIN HFA) 108 (90 BASE) MCG/ACT inhaler Inhale 1 puff into the lungs every 6 (six) hours as needed for wheezing or shortness of breath.   Not Taking  . enoxaparin (LOVENOX) 40 MG/0.4ML injection Inject 40 mg into the skin daily.   Taking    ROS:  Pain in pubic symphysis Physical Exam   Blood pressure 116/65,  pulse 96, temperature 98.1 F (36.7 C), temperature source Oral, resp. rate 16, height 5\' 8"  (1.727 m), weight 209 lb 12.8 oz (95.165 kg), last menstrual period 10/07/2013.  Physical Exam  Pain in symphysis pubis when changing positions and walking Chest clear Heart RRR without murmur Abd gravid, NT, FH 25 weeks Pelvic--small amount frothy d/c in vault.  Cervix closed and long, PP OOP. + tenderness on upper border of symphysis pubis. Ext Negative Homan's, no edema.  FHR Category 1 No UCs.  Results for orders placed or performed during the hospital encounter of 03/30/14 (from the past 24 hour(s))  Urinalysis, Routine w reflex microscopic     Status: Abnormal   Collection Time: 03/30/14 11:45 AM  Result Value Ref Range   Color, Urine YELLOW YELLOW   APPearance CLEAR CLEAR   Specific Gravity, Urine >1.030 (H) 1.005 - 1.030   pH 6.0 5.0 - 8.0   Glucose, UA NEGATIVE NEGATIVE mg/dL   Hgb urine dipstick NEGATIVE NEGATIVE   Bilirubin Urine NEGATIVE NEGATIVE   Ketones, ur NEGATIVE NEGATIVE mg/dL   Protein, ur NEGATIVE NEGATIVE mg/dL   Urobilinogen, UA 1.0 0.0 - 1.0 mg/dL   Nitrite NEGATIVE NEGATIVE   Leukocytes, UA SMALL (A) NEGATIVE  Urine microscopic-add on  Status: Abnormal   Collection Time: 03/30/14 11:45 AM  Result Value Ref Range   Squamous Epithelial / LPF FEW (A) RARE   WBC, UA 0-2 <3 WBC/hpf  Wet prep, genital     Status: Abnormal   Collection Time: 03/30/14 12:39 PM  Result Value Ref Range   Yeast Wet Prep HPF POC NONE SEEN NONE SEEN   Trich, Wet Prep NONE SEEN NONE SEEN   Clue Cells Wet Prep HPF POC MANY (A) NONE SEEN   WBC, Wet Prep HPF POC MANY (A) NONE SEEN     ED Course  Assessment: IUP at 24 6/7 weeks Pain in pubic symphysis/round ligament pain Hx DVT/PE 2010--on Lovenox BV  Plan: D/C home with comfort measures for round ligament pain and symphysis pubis pain. Rx Flexeril 10 mg po TID x 2-3 days, then prn. Rx Metronidazole 500 mg po BID x 7  days. OOW this week to allow for rest, then RTW.  Note written. [facility] or call prn. Continue Lovenox 40 mg SQ daily. Urine to culture.   Nigel BridgemanLATHAM, Keyston Ardolino CNM, MSN 03/30/2014 12:54 PM

## 2014-03-30 NOTE — Discharge Instructions (Signed)
Rest over the next 5 days. Take Flexeril regularly three times per day for 2-3 days, then as needed for pain--may make you sleepy.

## 2014-03-30 NOTE — MAU Note (Signed)
Patient states she has been having abdominal cramping and light spotting.

## 2014-03-30 NOTE — MAU Note (Signed)
No pain at rest. When standing up rates pain 7/10.

## 2014-03-31 LAB — URINE CULTURE
COLONY COUNT: NO GROWTH
Culture: NO GROWTH

## 2014-03-31 LAB — GC/CHLAMYDIA PROBE AMP
CT Probe RNA: NEGATIVE
GC PROBE AMP APTIMA: NEGATIVE

## 2014-04-01 LAB — CULTURE, BETA STREP (GROUP B ONLY)

## 2014-05-22 NOTE — L&D Delivery Note (Signed)
Delivery Note 33yo [name] @ 4320w4d who presented transitioning to active labor as she was 5cm on admission.  Augmentation was performed with AROM with meconium noted.  Pt progressed to complete and at 9:22 AM a viable female was delivered via Vaginal, Spontaneous Delivery (Presentation: Right Occiput Anterior).  APGAR: 8, 9; weight  .   Placenta status: Intact, Spontaneous.  Cord: 3 vessels with the following complications: None.    Pregnancy complicated by: H/o pulmonary embolus- on Lovenox during pregnancy then transitioned to Heparin.  Will restart Lovenox 12hr after delivery. -Asthma- no medications currently  Anesthesia: None  Episiotomy: None Lacerations: None Suture Repair: no repair Est. Blood Loss (mL): 250  Mom to postpartum.  Baby to Couplet care / Skin to Skin.  [name], [name], M 07/04/2014, 10:21 AM

## 2014-05-28 ENCOUNTER — Encounter (HOSPITAL_COMMUNITY): Payer: Self-pay | Admitting: *Deleted

## 2014-05-28 ENCOUNTER — Inpatient Hospital Stay (HOSPITAL_COMMUNITY)
Admission: AD | Admit: 2014-05-28 | Discharge: 2014-05-28 | Disposition: A | Discharge status: Home / Self Care | Payer: Medicaid Other | Source: Ambulatory Visit | Attending: Obstetrics and Gynecology | Admitting: Obstetrics and Gynecology

## 2014-05-28 DIAGNOSIS — N949 Unspecified condition associated with female genital organs and menstrual cycle: Secondary | ICD-10-CM | POA: Insufficient documentation

## 2014-05-28 DIAGNOSIS — Z86711 Personal history of pulmonary embolism: Secondary | ICD-10-CM | POA: Diagnosis not present

## 2014-05-28 DIAGNOSIS — Z3A33 33 weeks gestation of pregnancy: Secondary | ICD-10-CM | POA: Diagnosis not present

## 2014-05-28 DIAGNOSIS — M7918 Myalgia, other site: Secondary | ICD-10-CM

## 2014-05-28 DIAGNOSIS — Z880 Allergy status to penicillin: Secondary | ICD-10-CM | POA: Diagnosis not present

## 2014-05-28 DIAGNOSIS — O9989 Other specified diseases and conditions complicating pregnancy, childbirth and the puerperium: Secondary | ICD-10-CM | POA: Insufficient documentation

## 2014-05-28 LAB — URINALYSIS, ROUTINE W REFLEX MICROSCOPIC
Bilirubin Urine: NEGATIVE
GLUCOSE, UA: NEGATIVE mg/dL
Hgb urine dipstick: NEGATIVE
KETONES UR: NEGATIVE mg/dL
Nitrite: NEGATIVE
Protein, ur: NEGATIVE mg/dL
Specific Gravity, Urine: >1.03 — ABNORMAL HIGH (ref 1.005–1.030)
Urobilinogen, UA: 0.2 mg/dL (ref 0.0–1.0)
pH: 6 (ref 5.0–8.0)

## 2014-05-28 LAB — URINE MICROSCOPIC-ADD ON

## 2014-05-28 NOTE — MAU Provider Note (Signed)
Alicia Cardenas is a 34 y.o. G3P2002 at 33.2 weeks [facility]/o severe pelvic pain x 2 months.  She states she is unable to walk or stand unassisted.  She feels like she will fall.  She was Rx Flexeril but doesn't like taking it bc is is makes her very sleepy.  She denies ctx, cramping, vb, lof w/+FM.  She report she last ate this morning at 0845.   History     Patient Active Problem List   Diagnosis Date Noted  . Vaginal bleeding during pregnancy, antepartum 03/30/2014  . Asthma, chronic 03/30/2014  . Penicillin allergy 03/30/2014  . Pain in symphysis pubis during pregnancy 03/30/2014  . History of pulmonary embolus during pregnancy 12/30/2013    Chief Complaint  Patient presents with  . Pelvic Pain  . Headache   HPI  OB History    Gravida Para Term Preterm AB TAB SAB Ectopic Multiple Living   3 2 2  0 0 0 0 0 0 2      Past Medical History  Diagnosis Date  . PE (pulmonary embolism)   . Sickle cell trait   . Anemia     Past Surgical History  Procedure Laterality Date  . No past surgeries      Family History  Problem Relation Age of Onset  . Diabetes Maternal Uncle   . Heart disease Maternal Uncle   . Hypertension Maternal Uncle   . Hearing loss Neg Hx     History  Substance Use Topics  . Smoking status: Never Smoker   . Smokeless tobacco: Never Used  . Alcohol Use: No    Allergies:  Allergies  Allergen Reactions  . Penicillins Other (See Comments)    Childhood     Prescriptions prior to admission  Medication Sig Dispense Refill Last Dose  . albuterol (PROVENTIL HFA;VENTOLIN HFA) 108 (90 BASE) MCG/ACT inhaler Inhale 1 puff into the lungs every 6 (six) hours as needed for wheezing or shortness of breath.   Past Month at Unknown time  . enoxaparin (LOVENOX) 40 MG/0.4ML injection Inject 40 mg into the skin daily.   05/27/2014 at Unknown time  . Prenatal Vit-Fe Fumarate-FA (PRENATAL MULTIVITAMIN) TABS tablet Take 1 tablet by mouth daily at 12 noon.    05/28/2014 at Unknown time  . cyclobenzaprine (FLEXERIL) 10 MG tablet Take 1 tablet 3 times a day for 3 days, then as needed for pain. 30 tablet 2 more than one month    ROS See HPI above, all other systems are negative  Physical Exam   Blood pressure 121/60, pulse 97, temperature 98 F (36.7 C), temperature source Oral, resp. rate 18, last menstrual period 10/07/2013.  Physical Exam Ext:  WNL ABD: Soft, non tender to palpation, no rebound or guarding SVE: deferred   ED Course  Assessment: IUP at  33.2weeks Membranes: in tact FHR: Category 2, FHR 155, + accel, moderate variability, 1 decel at 1930 that happened when she tried to turn in the bed CTX:  occasional   Plan: Consult with Dr Su Hiltoberts Refer to PT     [facility] Jennipher Cardenas, CNM, MSN 05/28/2014. 7:36 PM

## 2014-05-28 NOTE — MAU Note (Signed)
C/O severe pelvic pain for the past 2 1/2 months, has been taking muscle relaxers & belly band, yoga - not working.  Pt states she gets a HA with every braxton hicks contraction.  Denies bleeding or LOF.

## 2014-05-28 NOTE — Progress Notes (Signed)
Pt states she is comfortable sitting when she move walks around pain increases

## 2014-05-28 NOTE — MAU Provider Note (Signed)
MAU Addendum Note  Results for orders placed or performed during the hospital encounter of 05/28/14 (from the past 24 hour(s))  Urinalysis, Routine w reflex microscopic     Status: Abnormal   Collection Time: 05/28/14  5:45 PM  Result Value Ref Range   Color, Urine YELLOW YELLOW   APPearance CLEAR CLEAR   Specific Gravity, Urine >1.030 (H) 1.005 - 1.030   pH 6.0 5.0 - 8.0   Glucose, UA NEGATIVE NEGATIVE mg/dL   Hgb urine dipstick NEGATIVE NEGATIVE   Bilirubin Urine NEGATIVE NEGATIVE   Ketones, ur NEGATIVE NEGATIVE mg/dL   Protein, ur NEGATIVE NEGATIVE mg/dL   Urobilinogen, UA 0.2 0.0 - 1.0 mg/dL   Nitrite NEGATIVE NEGATIVE   Leukocytes, UA TRACE (A) NEGATIVE  Urine microscopic-add on     Status: Abnormal   Collection Time: 05/28/14  5:45 PM  Result Value Ref Range   Squamous Epithelial / LPF FEW (A) RARE   WBC, UA 3-6 <3 WBC/hpf   Urine-Other MUCOUS PRESENT    FHT 140, mderate-mark variability, + accel, no decel, no ctx VE C/T/H  Plan: -Discussed need to follow up in office in 1 week -Expect a call for PT referral  -Bleeding and PTL Precautions -Encouraged to call if any questions or concerns arise prior to next scheduled office visit.  -Discharged to home in stable condition Consulted with Dr. Sande Brothersoberts   Latroya Ng, CNM, MSN 05/28/2014. 8:52 PM

## 2014-05-28 NOTE — Discharge Instructions (Signed)
Pelvic Pain Female pelvic pain can be caused by many different things and start from a variety of places. Pelvic pain refers to pain that is located in the lower half of the abdomen and between your hips. The pain may occur over a short period of time (acute) or may be reoccurring (chronic). The cause of pelvic pain may be related to disorders affecting the female reproductive organs (gynecologic), but it may also be related to the bladder, kidney stones, an intestinal complication, or muscle or skeletal problems. Getting help right away for pelvic pain is important, especially if there has been severe, sharp, or a sudden onset of unusual pain. It is also important to get help right away because some types of pelvic pain can be life threatening.  CAUSES  Below are only some of the causes of pelvic pain. The causes of pelvic pain can be in one of several categories.   Gynecologic.  Pelvic inflammatory disease.  Sexually transmitted infection.  Ovarian cyst or a twisted ovarian ligament (ovarian torsion).  Uterine lining that grows outside the uterus (endometriosis).  Fibroids, cysts, or tumors.  Ovulation.  Pregnancy.  Pregnancy that occurs outside the uterus (ectopic pregnancy).  Miscarriage.  Labor.  Abruption of the placenta or ruptured uterus.  Infection.  Uterine infection (endometritis).  Bladder infection.  Diverticulitis.  Miscarriage related to a uterine infection (septic abortion).  Bladder.  Inflammation of the bladder (cystitis).  Kidney stone(s).  Gastrointestinal.  Constipation.  Diverticulitis.  Neurologic.  Trauma.  Feeling pelvic pain because of mental or emotional causes (psychosomatic).  Cancers of the bowel or pelvis. EVALUATION  Your caregiver will want to take a careful history of your concerns. This includes recent changes in your health, a careful gynecologic history of your periods (menses), and a sexual history. Obtaining your family  history and medical history is also important. Your caregiver may suggest a pelvic exam. A pelvic exam will help identify the location and severity of the pain. It also helps in the evaluation of which organ system may be involved. In order to identify the cause of the pelvic pain and be properly treated, your caregiver may order tests. These tests may include:   A pregnancy test.  Pelvic ultrasonography.  An X-ray exam of the abdomen.  A urinalysis or evaluation of vaginal discharge.  Blood tests. HOME CARE INSTRUCTIONS   Only take over-the-counter or prescription medicines for pain, discomfort, or fever as directed by your caregiver.   Rest as directed by your caregiver.   Eat a balanced diet.   Drink enough fluids to make your urine clear or pale yellow, or as directed.   Avoid sexual intercourse if it causes pain.   Apply warm or cold compresses to the lower abdomen depending on which one helps the pain.   Avoid stressful situations.   Keep a journal of your pelvic pain. Write down when it started, where the pain is located, and if there are things that seem to be associated with the pain, such as food or your menstrual cycle.  Follow up with your caregiver as directed.  SEEK MEDICAL CARE IF:  Your medicine does not help your pain.  You have abnormal vaginal discharge. SEEK IMMEDIATE MEDICAL CARE IF:   You have heavy bleeding from the vagina.   Your pelvic pain increases.   You feel light-headed or faint.   You have chills.   You have pain with urination or blood in your urine.   You have uncontrolled diarrhea   or vomiting.   You have a fever or persistent symptoms for more than 3 days.  You have a fever and your symptoms suddenly get worse.   You are being physically or sexually abused.  MAKE SURE YOU:  Understand these instructions.  Will watch your condition.  Will get help if you are not doing well or get worse. Document Released:  04/04/2004 Document Revised: 09/22/2013 Document Reviewed: 08/28/2011 ExitCare Patient Information 2015 [employer]. This information is not intended to replace advice given to you by your health care provider. Make sure you discuss any questions you have with your health care provider.  

## 2014-06-09 ENCOUNTER — Inpatient Hospital Stay (HOSPITAL_COMMUNITY)
Admission: AD | Admit: 2014-06-09 | Discharge: 2014-06-09 | Disposition: A | Discharge status: Home / Self Care | Payer: Medicaid Other | Source: Ambulatory Visit | Attending: Obstetrics and Gynecology | Admitting: Obstetrics and Gynecology

## 2014-06-09 ENCOUNTER — Encounter (HOSPITAL_COMMUNITY): Payer: Self-pay | Admitting: *Deleted

## 2014-06-09 DIAGNOSIS — R42 Dizziness and giddiness: Secondary | ICD-10-CM

## 2014-06-09 DIAGNOSIS — Z3A35 35 weeks gestation of pregnancy: Secondary | ICD-10-CM | POA: Insufficient documentation

## 2014-06-09 DIAGNOSIS — R55 Syncope and collapse: Secondary | ICD-10-CM | POA: Insufficient documentation

## 2014-06-09 DIAGNOSIS — Z86711 Personal history of pulmonary embolism: Secondary | ICD-10-CM | POA: Diagnosis not present

## 2014-06-09 DIAGNOSIS — O9989 Other specified diseases and conditions complicating pregnancy, childbirth and the puerperium: Secondary | ICD-10-CM | POA: Insufficient documentation

## 2014-06-09 DIAGNOSIS — Z880 Allergy status to penicillin: Secondary | ICD-10-CM | POA: Insufficient documentation

## 2014-06-09 LAB — CBC WITH DIFFERENTIAL/PLATELET
Basophils Absolute: 0 10*3/uL (ref 0.0–0.1)
Basophils Relative: 0 % (ref 0–1)
Eosinophils Absolute: 0.1 10*3/uL (ref 0.0–0.7)
Eosinophils Relative: 1 % (ref 0–5)
HCT: 36.2 % (ref 36.0–46.0)
Hemoglobin: 12.1 g/dL (ref 12.0–15.0)
Lymphocytes Relative: 12 % (ref 12–46)
Lymphs Abs: 1.2 10*3/uL (ref 0.7–4.0)
MCH: 30.7 pg (ref 26.0–34.0)
MCHC: 33.4 g/dL (ref 30.0–36.0)
MCV: 91.9 fL (ref 78.0–100.0)
Monocytes Absolute: 0.9 10*3/uL (ref 0.1–1.0)
Monocytes Relative: 9 % (ref 3–12)
Neutro Abs: 8 10*3/uL — ABNORMAL HIGH (ref 1.7–7.7)
Neutrophils Relative %: 79 % — ABNORMAL HIGH (ref 43–77)
Platelets: 206 10*3/uL (ref 150–400)
RBC: 3.94 MIL/uL (ref 3.87–5.11)
RDW: 14.1 % (ref 11.5–15.5)
WBC: 10.2 10*3/uL (ref 4.0–10.5)

## 2014-06-09 LAB — COMPREHENSIVE METABOLIC PANEL WITH GFR
ALT: 12 U/L (ref 0–35)
AST: 16 U/L (ref 0–37)
Albumin: 2.8 g/dL — ABNORMAL LOW (ref 3.5–5.2)
Alkaline Phosphatase: 103 U/L (ref 39–117)
Anion gap: 7 (ref 5–15)
BUN: 12 mg/dL (ref 6–23)
CO2: 20 mmol/L (ref 19–32)
Calcium: 8.6 mg/dL (ref 8.4–10.5)
Chloride: 107 meq/L (ref 96–112)
Creatinine, Ser: 0.5 mg/dL (ref 0.50–1.10)
GFR calc Af Amer: >90 mL/min
GFR calc non Af Amer: >90 mL/min
Glucose, Bld: 118 mg/dL — ABNORMAL HIGH (ref 70–99)
Potassium: 3.6 mmol/L (ref 3.5–5.1)
Sodium: 134 mmol/L — ABNORMAL LOW (ref 135–145)
Total Bilirubin: 0.1 mg/dL — ABNORMAL LOW (ref 0.3–1.2)
Total Protein: 6.6 g/dL (ref 6.0–8.3)

## 2014-06-09 LAB — PROTEIN / CREATININE RATIO, URINE
Creatinine, Urine: 274 mg/dL
Protein Creatinine Ratio: 0.05 (ref 0.00–0.15)
Total Protein, Urine: 15 mg/dL

## 2014-06-09 LAB — GLUCOSE, CAPILLARY: Glucose-Capillary: 105 mg/dL — ABNORMAL HIGH (ref 70–99)

## 2014-06-09 LAB — URIC ACID: Uric Acid, Serum: 4.7 mg/dL (ref 2.4–7.0)

## 2014-06-09 MED ORDER — LACTATED RINGERS IV BOLUS (SEPSIS)
1000.0000 mL | Freq: Once | INTRAVENOUS | Status: AC
  Administered 2014-06-09: 1000 mL via INTRAVENOUS

## 2014-06-09 NOTE — Discharge Instructions (Signed)
Syncope °Syncope is a medical term for fainting or passing out. This means you lose consciousness and drop to the ground. People are generally unconscious for less than 5 minutes. You may have some muscle twitches for up to 15 seconds before waking up and returning to normal. Syncope occurs more often in older adults, but it can happen to anyone. While most causes of syncope are not dangerous, syncope can be a sign of a serious medical problem. It is important to seek medical care.  °CAUSES  °Syncope is caused by a sudden drop in blood flow to the brain. The specific cause is often not determined. Factors that can bring on syncope include: °· Taking medicines that lower blood pressure. °· Sudden changes in posture, such as standing up quickly. °· Taking more medicine than prescribed. °· Standing in one place for too long. °· Seizure disorders. °· Dehydration and excessive exposure to heat. °· Low blood sugar (hypoglycemia). °· Straining to have a bowel movement. °· Heart disease, irregular heartbeat, or other circulatory problems. °· Fear, emotional distress, seeing blood, or severe pain. °SYMPTOMS  °Right before fainting, you may: °· Feel dizzy or light-headed. °· Feel nauseous. °· See all white or all black in your field of vision. °· Have cold, clammy skin. °DIAGNOSIS  °Your health care provider will ask about your symptoms, perform a physical exam, and perform an electrocardiogram (ECG) to record the electrical activity of your heart. Your health care provider may also perform other heart or blood tests to determine the cause of your syncope which may include: °· Transthoracic echocardiogram (TTE). During echocardiography, sound waves are used to evaluate how blood flows through your heart. °· Transesophageal echocardiogram (TEE). °· Cardiac monitoring. This allows your health care provider to monitor your heart rate and rhythm in real time. °· Holter monitor. This is a portable device that records your  heartbeat and can help diagnose heart arrhythmias. It allows your health care provider to track your heart activity for several days, if needed. °· Stress tests by exercise or by giving medicine that makes the heart beat faster. °TREATMENT  °In most cases, no treatment is needed. Depending on the cause of your syncope, your health care provider may recommend changing or stopping some of your medicines. °HOME CARE INSTRUCTIONS °· Have someone stay with you until you feel stable. °· Do not drive, use machinery, or play sports until your health care provider says it is okay. °· Keep all follow-up appointments as directed by your health care provider. °· Lie down right away if you start feeling like you might faint. Breathe deeply and steadily. Wait until all the symptoms have passed. °· Drink enough fluids to keep your urine clear or pale yellow. °· If you are taking blood pressure or heart medicine, get up slowly and take several minutes to sit and then stand. This can reduce dizziness. °SEEK IMMEDIATE MEDICAL CARE IF:  °· You have a severe headache. °· You have unusual pain in the chest, abdomen, or back. °· You are bleeding from your mouth or rectum, or you have black or tarry stool. °· You have an irregular or very fast heartbeat. °· You have pain with breathing. °· You have repeated fainting or seizure-like jerking during an episode. °· You faint when sitting or lying down. °· You have confusion. °· You have trouble walking. °· You have severe weakness. °· You have vision problems. °If you fainted, call your local emergency services (911 in [place] Do not drive   yourself to the hospital.  °MAKE SURE YOU: °· Understand these instructions. °· Will watch your condition. °· Will get help right away if you are not doing well or get worse. °Document Released: 05/08/2005 Document Revised: 05/13/2013 Document Reviewed: 07/07/2011 °ExitCare® Patient Information ©2015 [employer]. This information is not intended to replace  advice given to you by your health care provider. Make sure you discuss any questions you have with your health care provider. ° °

## 2014-06-09 NOTE — MAU Provider Note (Signed)
MAU Addendum Note  Results for orders placed or performed during the hospital encounter of 06/09/14 (from the past 24 hour(s))  Protein / creatinine ratio, urine     Status: None   Collection Time: 06/09/14 12:11 PM  Result Value Ref Range   Creatinine, Urine 274.00 mg/dL   Total Protein, Urine 15 mg/dL   Protein Creatinine Ratio 0.05 0.00 - 0.15  CBC with Differential     Status: Abnormal   Collection Time: 06/09/14  1:15 PM  Result Value Ref Range   WBC 10.2 4.0 - 10.5 K/uL   RBC 3.94 3.87 - 5.11 MIL/uL   Hemoglobin 12.1 12.0 - 15.0 g/dL   HCT 56.236.2 13.036.0 - 86.546.0 %   MCV 91.9 78.0 - 100.0 fL   MCH 30.7 26.0 - 34.0 pg   MCHC 33.4 30.0 - 36.0 g/dL   RDW 78.414.1 69.611.5 - 29.515.5 %   Platelets 206 150 - 400 K/uL   Neutrophils Relative % 79 (H) 43 - 77 %   Neutro Abs 8.0 (H) 1.7 - 7.7 K/uL   Lymphocytes Relative 12 12 - 46 %   Lymphs Abs 1.2 0.7 - 4.0 K/uL   Monocytes Relative 9 3 - 12 %   Monocytes Absolute 0.9 0.1 - 1.0 K/uL   Eosinophils Relative 1 0 - 5 %   Eosinophils Absolute 0.1 0.0 - 0.7 K/uL   Basophils Relative 0 0 - 1 %   Basophils Absolute 0.0 0.0 - 0.1 K/uL  Uric acid     Status: None   Collection Time: 06/09/14  1:15 PM  Result Value Ref Range   Uric Acid, Serum 4.7 2.4 - 7.0 mg/dL  Comprehensive metabolic panel     Status: Abnormal   Collection Time: 06/09/14  1:15 PM  Result Value Ref Range   Sodium 134 (L) 135 - 145 mmol/L   Potassium 3.6 3.5 - 5.1 mmol/L   Chloride 107 96 - 112 mEq/L   CO2 20 19 - 32 mmol/L   Glucose, Bld 118 (H) 70 - 99 mg/dL   BUN 12 6 - 23 mg/dL   Creatinine, Ser 2.840.50 0.50 - 1.10 mg/dL   Calcium 8.6 8.4 - 13.210.5 mg/dL   Total Protein 6.6 6.0 - 8.3 g/dL   Albumin 2.8 (L) 3.5 - 5.2 g/dL   AST 16 0 - 37 U/L   ALT 12 0 - 35 U/L   Alkaline Phosphatase 103 39 - 117 U/L   Total Bilirubin 0.1 (L) 0.3 - 1.2 mg/dL   GFR calc non Af Amer >90 >90 mL/min   GFR calc Af Amer >90 >90 mL/min   Anion gap 7 5 - 15  Glucose, capillary     Status: Abnormal   Collection Time: 06/09/14  1:23 PM  Result Value Ref Range   Glucose-Capillary 105 (H) 70 - 99 mg/dL     Plan: -Discussed need to follow up in office before Friday -pt to have small frequent meals -Bleeding and PTL Precautions -Encouraged to call if any questions or concerns arise prior to next scheduled office visit.  -Discharged to home in stable condition Consulted with Dr. Maureen Ralphsillard   Giara Mcgaughey, CNM, MSN 06/09/2014. 2:59 PM

## 2014-06-09 NOTE — MAU Provider Note (Signed)
Alicia Cardenas is a 34 y.o. G3P2002 at 35.0 weeks arrived to MAU via EMS c/o LOC last night and again this morning at work for an unknown amount of time.  She describes an increase in floater (vision changes) th at change colors from multi colors to blacks, hotness and dizziness.  She report eating both lst night and today.  She denies falling and hitting her abd either time.  She denies vb, lof with +FM.  She arrived with and 18g IV from EMS.  She still c/o pelvic pain.   History     Patient Active Problem List   Diagnosis Date Noted  . Vaginal bleeding during pregnancy, antepartum 03/30/2014  . Asthma, chronic 03/30/2014  . Penicillin allergy 03/30/2014  . Pain in symphysis pubis during pregnancy 03/30/2014  . History of pulmonary embolus during pregnancy 12/30/2013    Chief Complaint  Patient presents with  . Loss of Consciousness   HPI  OB History    Gravida Para Term Preterm AB TAB SAB Ectopic Multiple Living   '3 2 2 '$ 0 0 0 0 0 0 2      Past Medical History  Diagnosis Date  . PE (pulmonary embolism)   . Sickle cell trait   . Anemia     Past Surgical History  Procedure Laterality Date  . No past surgeries      Family History  Problem Relation Age of Onset  . Diabetes Maternal Uncle   . Heart disease Maternal Uncle   . Hypertension Maternal Uncle   . Hearing loss Neg Hx     History  Substance Use Topics  . Smoking status: Never Smoker   . Smokeless tobacco: Never Used  . Alcohol Use: No    Allergies:  Allergies  Allergen Reactions  . Penicillins Other (See Comments)    Childhood     Prescriptions prior to admission  Medication Sig Dispense Refill Last Dose  . albuterol (PROVENTIL HFA;VENTOLIN HFA) 108 (90 BASE) MCG/ACT inhaler Inhale 1 puff into the lungs every 6 (six) hours as needed for wheezing or shortness of breath.   Past Month at Unknown time  . cyclobenzaprine (FLEXERIL) 10 MG tablet Take 1 tablet 3 times a day for 3 days, then as needed for  pain. 30 tablet 2 more than one month  . enoxaparin (LOVENOX) 40 MG/0.4ML injection Inject 40 mg into the skin daily.   05/27/2014 at Unknown time  . Prenatal Vit-Fe Fumarate-FA (PRENATAL MULTIVITAMIN) TABS tablet Take 1 tablet by mouth daily at 12 noon.   05/28/2014 at Unknown time    ROS See HPI above, all other systems are negative  Physical Exam   Blood pressure 130/77, pulse 95, temperature 97.7 F (36.5 C), temperature source Oral, resp. rate 20, last menstrual period 10/07/2013.  Physical Exam Ext:  WNL ABD: Soft, non tender to palpation, no rebound or guarding SVE: deferred   ED Course  Assessment: IUP at  35.0weeks Membranes: intact FHR: Category 1 CTX:  none  Plan:  Labs: CBG, CBC, CMP, uric acid, PCR IV fluid Consult with Dr. Cathren Laine Lakela Kuba, CNM, MSN 06/09/2014. 12:53 PM

## 2014-06-09 NOTE — MAU Note (Signed)
Pt came in EMS with C/O syncopal episode last evening at home & again this morning at work.  Pt states her face became hot and she was seeing floaters & could tell she was going to "go out."  Denies complete loss of consciousness or falling this a.m.  States she is having some braxton hicks uc's, denies bleeding or LOF.  Pt crying, upset - states she was not treated well by EMS.

## 2014-06-09 NOTE — MAU Note (Signed)
Urine in lab 

## 2014-06-18 LAB — OB RESULTS CONSOLE GC/CHLAMYDIA: CHLAMYDIA, DNA PROBE: NEGATIVE

## 2014-06-18 LAB — OB RESULTS CONSOLE GBS: GBS: NEGATIVE

## 2014-07-04 ENCOUNTER — Encounter (HOSPITAL_COMMUNITY): Payer: Self-pay | Admitting: *Deleted

## 2014-07-04 ENCOUNTER — Inpatient Hospital Stay (HOSPITAL_COMMUNITY)
Admission: AD | Admit: 2014-07-04 | Discharge: 2014-07-06 | DRG: 767 | Disposition: A | Discharge status: Home / Self Care | Payer: Medicaid Other | Source: Ambulatory Visit | Attending: Obstetrics and Gynecology | Admitting: Obstetrics and Gynecology

## 2014-07-04 DIAGNOSIS — Z3A38 38 weeks gestation of pregnancy: Secondary | ICD-10-CM

## 2014-07-04 DIAGNOSIS — Z8759 Personal history of other complications of pregnancy, childbirth and the puerperium: Secondary | ICD-10-CM

## 2014-07-04 DIAGNOSIS — Z880 Allergy status to penicillin: Secondary | ICD-10-CM

## 2014-07-04 DIAGNOSIS — I499 Cardiac arrhythmia, unspecified: Secondary | ICD-10-CM | POA: Diagnosis present

## 2014-07-04 DIAGNOSIS — J45909 Unspecified asthma, uncomplicated: Secondary | ICD-10-CM | POA: Diagnosis present

## 2014-07-04 DIAGNOSIS — Z302 Encounter for sterilization: Secondary | ICD-10-CM | POA: Diagnosis not present

## 2014-07-04 DIAGNOSIS — Z86711 Personal history of pulmonary embolism: Secondary | ICD-10-CM

## 2014-07-04 DIAGNOSIS — Z3483 Encounter for supervision of other normal pregnancy, third trimester: Secondary | ICD-10-CM | POA: Diagnosis present

## 2014-07-04 HISTORY — DX: Unspecified asthma, uncomplicated: J45.909

## 2014-07-04 LAB — CBC
HEMATOCRIT: 34.6 % — AB (ref 36.0–46.0)
HEMOGLOBIN: 11.8 g/dL — AB (ref 12.0–15.0)
MCH: 31.2 pg (ref 26.0–34.0)
MCHC: 34.1 g/dL (ref 30.0–36.0)
MCV: 91.5 fL (ref 78.0–100.0)
PLATELETS: 183 10*3/uL (ref 150–400)
RBC: 3.78 MIL/uL — AB (ref 3.87–5.11)
RDW: 14.3 % (ref 11.5–15.5)
WBC: 7.7 10*3/uL (ref 4.0–10.5)

## 2014-07-04 LAB — TYPE AND SCREEN
ABO/RH(D): A POS
ANTIBODY SCREEN: NEGATIVE

## 2014-07-04 LAB — MRSA PCR SCREENING: MRSA by PCR: NEGATIVE

## 2014-07-04 MED ORDER — ZOLPIDEM TARTRATE 5 MG PO TABS: 5.0000 mg | ORAL_TABLET | Freq: Every evening | ORAL | Status: DC | PRN

## 2014-07-04 MED ORDER — OXYCODONE-ACETAMINOPHEN 5-325 MG PO TABS: 2.0000 | ORAL_TABLET | ORAL | Status: DC | PRN

## 2014-07-04 MED ORDER — ONDANSETRON HCL 4 MG/2ML IJ SOLN: 4.0000 mg | Freq: Four times a day (QID) | INTRAMUSCULAR | Status: DC | PRN

## 2014-07-04 MED ORDER — OXYTOCIN 40 UNITS IN LACTATED RINGERS INFUSION - SIMPLE MED
62.5000 mL/h | INTRAVENOUS | Status: DC
  Administered 2014-07-04: 62.5 mL/h via INTRAVENOUS
  Filled 2014-07-04: qty 1000

## 2014-07-04 MED ORDER — OXYTOCIN BOLUS FROM INFUSION
500.0000 mL | INTRAVENOUS | Status: DC
  Administered 2014-07-04: 500 mL via INTRAVENOUS

## 2014-07-04 MED ORDER — LIDOCAINE HCL (PF) 1 % IJ SOLN
30.0000 mL | INTRAMUSCULAR | Status: DC | PRN
  Filled 2014-07-04: qty 30

## 2014-07-04 MED ORDER — PRENATAL MULTIVITAMIN CH
1.0000 | ORAL_TABLET | Freq: Every day | ORAL | Status: DC
  Administered 2014-07-04 – 2014-07-06 (×2): 1 via ORAL
  Filled 2014-07-04 (×3): qty 1

## 2014-07-04 MED ORDER — METOCLOPRAMIDE HCL 10 MG PO TABS
10.0000 mg | ORAL_TABLET | Freq: Once | ORAL | Status: AC
  Administered 2014-07-05: 10 mg via ORAL
  Filled 2014-07-04: qty 1

## 2014-07-04 MED ORDER — FAMOTIDINE 20 MG PO TABS: 40.0000 mg | ORAL_TABLET | Freq: Once | ORAL | Status: DC

## 2014-07-04 MED ORDER — IBUPROFEN 600 MG PO TABS
600.0000 mg | ORAL_TABLET | Freq: Four times a day (QID) | ORAL | Status: DC
  Administered 2014-07-04 – 2014-07-06 (×8): 600 mg via ORAL
  Filled 2014-07-04 (×10): qty 1

## 2014-07-04 MED ORDER — METOCLOPRAMIDE HCL 10 MG PO TABS: 10.0000 mg | ORAL_TABLET | Freq: Once | ORAL | Status: DC

## 2014-07-04 MED ORDER — NALBUPHINE HCL 10 MG/ML IJ SOLN: 10.0000 mg | INTRAMUSCULAR | Status: DC | PRN

## 2014-07-04 MED ORDER — OXYCODONE-ACETAMINOPHEN 5-325 MG PO TABS
1.0000 | ORAL_TABLET | ORAL | Status: DC | PRN
  Administered 2014-07-05: 1 via ORAL
  Filled 2014-07-04: qty 1

## 2014-07-04 MED ORDER — ONDANSETRON HCL 4 MG/2ML IJ SOLN: 4.0000 mg | INTRAMUSCULAR | Status: DC | PRN

## 2014-07-04 MED ORDER — LACTATED RINGERS IV SOLN: 500.0000 mL | INTRAVENOUS | Status: DC | PRN

## 2014-07-04 MED ORDER — SIMETHICONE 80 MG PO CHEW
80.0000 mg | CHEWABLE_TABLET | ORAL | Status: DC | PRN
Start: 2014-07-04 — End: 2014-07-06

## 2014-07-04 MED ORDER — TETANUS-DIPHTH-ACELL PERTUSSIS 5-2.5-18.5 LF-MCG/0.5 IM SUSP: 0.5000 mL | Freq: Once | INTRAMUSCULAR | Status: DC

## 2014-07-04 MED ORDER — ONDANSETRON HCL 4 MG PO TABS: 4.0000 mg | ORAL_TABLET | ORAL | Status: DC | PRN

## 2014-07-04 MED ORDER — LACTATED RINGERS IV SOLN
INTRAVENOUS | Status: DC
  Administered 2014-07-05: 08:00:00 via INTRAVENOUS
  Administered 2014-07-05: 900 mL via INTRAVENOUS

## 2014-07-04 MED ORDER — BENZOCAINE-MENTHOL 20-0.5 % EX AERO: 1.0000 "application " | INHALATION_SPRAY | CUTANEOUS | Status: DC | PRN

## 2014-07-04 MED ORDER — DIPHENHYDRAMINE HCL 25 MG PO CAPS: 25.0000 mg | ORAL_CAPSULE | Freq: Four times a day (QID) | ORAL | Status: DC | PRN

## 2014-07-04 MED ORDER — LACTATED RINGERS IV SOLN
INTRAVENOUS | Status: DC
  Administered 2014-07-04: 05:00:00 via INTRAVENOUS

## 2014-07-04 MED ORDER — CITRIC ACID-SODIUM CITRATE 334-500 MG/5ML PO SOLN: 30.0000 mL | ORAL | Status: DC | PRN

## 2014-07-04 MED ORDER — LANOLIN HYDROUS EX OINT
TOPICAL_OINTMENT | CUTANEOUS | Status: DC | PRN
Start: 2014-07-04 — End: 2014-07-06

## 2014-07-04 MED ORDER — ACETAMINOPHEN 325 MG PO TABS: 650.0000 mg | ORAL_TABLET | ORAL | Status: DC | PRN

## 2014-07-04 MED ORDER — WITCH HAZEL-GLYCERIN EX PADS: 1.0000 "application " | MEDICATED_PAD | CUTANEOUS | Status: DC | PRN

## 2014-07-04 MED ORDER — SENNOSIDES-DOCUSATE SODIUM 8.6-50 MG PO TABS
2.0000 | ORAL_TABLET | ORAL | Status: DC
  Administered 2014-07-04 – 2014-07-06 (×2): 2 via ORAL
  Filled 2014-07-04 (×2): qty 2

## 2014-07-04 MED ORDER — DIBUCAINE 1 % RE OINT: 1.0000 "application " | TOPICAL_OINTMENT | RECTAL | Status: DC | PRN

## 2014-07-04 MED ORDER — ENOXAPARIN SODIUM 40 MG/0.4ML ~~LOC~~ SOLN
40.0000 mg | SUBCUTANEOUS | Status: DC
  Filled 2014-07-04: qty 0.4

## 2014-07-04 MED ORDER — FAMOTIDINE 20 MG PO TABS
40.0000 mg | ORAL_TABLET | Freq: Once | ORAL | Status: AC
  Administered 2014-07-05: 40 mg via ORAL
  Filled 2014-07-04: qty 2

## 2014-07-04 MED ORDER — OXYCODONE-ACETAMINOPHEN 5-325 MG PO TABS: 1.0000 | ORAL_TABLET | ORAL | Status: DC | PRN

## 2014-07-04 NOTE — H&P (Signed)
Alicia Cardenas is a 34 y.o. female, [id number] at 38.4 weeks, presenting for contractions. Reports contractions started at 10pm and have been consistent.  Patient reports desire to have unmedicated labor and delivery.  Patient is GBS negative and currently on heparin for h/o PE in 2009, last dose was yesterday.   Patient Active Problem List   Diagnosis Date Noted  . Vaginal bleeding during pregnancy, antepartum 03/30/2014  . Asthma, chronic 03/30/2014  . Penicillin allergy 03/30/2014  . Pain in symphysis pubis during pregnancy 03/30/2014  . History of pulmonary embolus during pregnancy 12/30/2013    History of present pregnancy: Patient entered care at 11 weeks.   EDC of 07/14/2014 was established by Definite LMP of 10/07/2013.   Anatomy scan:  19.1 weeks, with normal findings and an anterior placenta.   Additional [place] evaluations:   -Growth [place] at 32.2wks: Vertex, Anterior placenta, Normal AFI in 70th%ile, EFW 81st%ile at 4lbs 7oz.   Significant prenatal events:  Patient with common pregnancy complaints including back pain, pelvic pressure, and headaches.  Patient had 2 episodes of syncope at 35wks.  Patient was seen by physical therapy for pelvic pressure causing difficulties in ambulation.  Patient on lovenox, daily, for history of PE and switched to heparin at 37wks.   Last evaluation:  07/01/2014 by Dr. Carmela Hurt, VE 2-3/50/-2, FHR 142, BP 110/64, Wt 229lbs  OB History    Gravida Para Term Preterm AB TAB SAB Ectopic Multiple Living   0 0 0 0 0 0 2     Past Medical History  Diagnosis Date  . PE (pulmonary embolism)   . Sickle cell trait   . Anemia    Past Surgical History  Procedure Laterality Date  . No past surgeries     Family History: family history includes Diabetes in her maternal uncle; Heart disease in her maternal uncle; Hypertension in her maternal uncle. There is no history of Hearing loss. Social History:  reports that she has never smoked. She has never used smokeless  tobacco. She reports that she does not drink alcohol or use illicit drugs.   Prenatal Transfer Tool  Maternal Diabetes: No Genetic Screening: Declined Maternal Ultrasounds/Referrals: Normal Fetal Ultrasounds or other Referrals:  None Maternal Substance Abuse:  No Significant Maternal Medications:  None Significant Maternal Lab Results: Lab values include: Group B Strep negative    ROS:  +Contractions, +FM, -LoF, -VB  Allergies  Allergen Reactions  . Penicillins Other (See Comments)    Childhood      Dilation: 5.5 Effacement (%): 50 Station: -2 Exam by:: J Tena Linebaugh, CNM Blood pressure 131/70, pulse 86, temperature 98 F (36.7 C), resp. rate 18, height 5' 6.5" (1.689 m), weight 231 lb (104.781 kg), last menstrual period 10/07/2013.  Physical Exam: General appearance: alert, well appearing, and in no distress. Head; normocephalic, atraumatic. PERLA. ENT- ENT exam normal, no neck nodes or sinus tenderness. Chest: clear to auscultation, no wheezes, rales or rhonchi, symmetric air entry.  CVS exam: normal rate and regular rhythm. Abdominal exam: Non-tender, Appears AGA,  Skin exam - normal coloration and turgor, no rashes, no suspicious skin lesions noted. Exam of extremities: peripheral pulses normal, no pedal edema, no clubbing or cyanosis  FHR: 145 bpm, Mod Var, -Decels, +Accels UCs: Q2-76min, palpates moderate  Prenatal labs: ABO, Rh: --/--/A POS (06/22 1925) Antibody: Negative (07/30 0000) Rubella:   Immune RPR: Nonreactive (07/30 0000)  HBsAg: Negative (07/30 0000)  HIV: Non-reactive (07/30 0000)  GBS: Negative (01/28 0000)  Sickle cell/Hgb electrophoresis: Unknown Pap:  Unknown GC:  Negative Chlamydia:  Negative Genetic screenings:  Declined Glucola:  Normal Other:  None    Assessment IUP at 38.4wks Cat I FT Active Labor H/O Pulmonary Embolism H/O Asthma  [facility]: [facility] consult with Dr. Katharine LookJ. Ozan Routine Labor and Delivery Orders per  [facility] Protocol Okay for intermittent monitoring  Phillips ClimesMLY, Abubakar Crispo LYNNCNM, MSN 07/04/2014, 4:07 AM

## 2014-07-04 NOTE — MAU Note (Signed)
Pt reports contractions since 1030 pm now about 6 min apart. .the patient denies SROM or bleeding at this time and reports good fetal movement.

## 2014-07-04 NOTE — Progress Notes (Signed)
Alicia Cardenas MRN: [id number]  Subjective: -Patient Alicia Burycoping well with contractions.  Husband, Alicia Cardenas, remains at bedside.  Objective: BP 126/66 mmHg  Pulse 82  Temp(Src) 98.2 F (36.8 C) (Oral)  Resp 18  Ht 5' 6.5" (1.689 m)  Wt 231 lb (104.781 kg)  BMI 36.73 kg/m2  LMP 10/07/2013     FHT: 125 bpm by doppler UC:   Palpates moderate to strong SVE:   Dilation: 7 Effacement (%): 70 Station: -2 Exam by:: Alicia Cardenas, Alicia Cardenas Membranes: AROM at 0620 Pitocin:None  Assessment:  IUP at 38.4wks Labor Augmentation Amniotomy  Plan: -Discussed r/b of AROM, patient agrees and desires -Monitor per hospital policy -Continue other mgmt as ordered  [facility] IncEMLY, Alicia Cardenas LYNN,MSN, Alicia Cardenas 07/04/2014, 6:39 AM

## 2014-07-04 NOTE — MAU Note (Signed)
[facility]  Pt to be admitted to room 168.

## 2014-07-04 NOTE — MAU Provider Note (Signed)
History    Alicia Cardenas is a 33y.o. [name] at 38.4wks who presents, unannounced, for contractions.  Patient states she started contracting around 10pm.  Patient denies LOF, VB, and reports active fetus.  Patient states that she was 2-3cm in the office on 2/10.  Patient desires unmedicated labor and childbirth.     Patient Active Problem List   Diagnosis Date Noted  . Vaginal bleeding during pregnancy, antepartum 03/30/2014  . Asthma, chronic 03/30/2014  . Penicillin allergy 03/30/2014  . Pain in symphysis pubis during pregnancy 03/30/2014  . History of pulmonary embolus during pregnancy 12/30/2013    Chief Complaint  Patient presents with  . Labor Eval   HPI  OB History    Gravida Para Term Preterm AB TAB SAB Ectopic Multiple Living   3 2 2  0 0 0 0 0 0 2      Past Medical History  Diagnosis Date  . PE (pulmonary embolism)   . Sickle cell trait   . Anemia     Past Surgical History  Procedure Laterality Date  . No past surgeries      Family History  Problem Relation Age of Onset  . Diabetes Maternal Uncle   . Heart disease Maternal Uncle   . Hypertension Maternal Uncle   . Hearing loss Neg Hx     History  Substance Use Topics  . Smoking status: Never Smoker   . Smokeless tobacco: Never Used  . Alcohol Use: No    Allergies:  Allergies  Allergen Reactions  . Penicillins Other (See Comments)    Childhood     Prescriptions prior to admission  Medication Sig Dispense Refill Last Dose  . albuterol (PROVENTIL HFA;VENTOLIN HFA) 108 (90 BASE) MCG/ACT inhaler Inhale 1 puff into the lungs every 6 (six) hours as needed for wheezing or shortness of breath.   Past Month at Unknown time  . cyclobenzaprine (FLEXERIL) 10 MG tablet Take 1 tablet 3 times a day for 3 days, then as needed for pain. (Patient not taking: Reported on 06/09/2014) 30 tablet 2 more than one month  . Prenatal Vit-Fe Fumarate-FA (PRENATAL MULTIVITAMIN) TABS tablet Take 1 tablet by mouth daily at 12  noon.   06/09/2014 at Unknown time    ROS  See HPI Above Physical Exam   Blood pressure 131/70, pulse 86, temperature 98 F (36.7 C), resp. rate 18, height 5' 6.5" (1.689 m), weight 231 lb (104.781 kg), last menstrual period 10/07/2013.  Physical Exam SVE: 4/50/-2 UC: Q1-324min, palpates moderate  ED Course  Assessment: IUP at 38.4wks Cat I FT Early Labor  Plan: -Patient given options: Ambulate, Discharge to home, or Observe with or w/o pain medication -Patient desires discharge to home, but expresses concern with high pain tolerance stating she has had 2 unmedicated births and has had no pain response -Considering information, provider and patient decided it best that she be observed for 1.5-2 hours.   Follow Up (0400) -Patient reports intensity of contractions has changed -SVE: 5-6/50/-2, BBOW -Admit to BS  Shatia Sindoni LYNN CNM, MSN 07/04/2014 2:15 AM

## 2014-07-04 NOTE — Progress Notes (Signed)
Ivar BuryJenice Theisen MRN: [id number]  Subjective: Pt starting to feel more pressure  Objective: BP 127/79 mmHg  Pulse 90  Temp(Src) 98.5 F (36.9 C) (Oral)  Resp 18  Ht 5' 6.5" (1.689 m)  Wt 104.781 kg (231 lb)  BMI 36.73 kg/m2  LMP 10/07/2013     FHT: 130 baseline UC:   Palpates moderate to strong SVE:   Dilation: 8 Effacement (%): 90 Station: 0 Exam by:: Mitsuye Schrodt Membranes: AROM at 0620 Pitocin:None  Assessment:  IUP at 38.4wks FWB reassuring by intermittent monitoring Continue expectant management   Myna HidalgoOZAN, Delylah Stanczyk, M,MSN, CNM 07/04/2014, 9:12 AM

## 2014-07-04 NOTE — Lactation Note (Signed)
This note was copied from the chart of Alicia Ivar BuryJenice No. Lactation Consultation Note  P3, First time breastfeeding.  Baby has breastfedx1 at 3 hours. Demonstrated hand expression and it squirted in the air.  Family pleased. Discussed making sure baby latches deep on breast.  Reviewed feeding cues and feeding on demand. Mom made aware of O/P services, breastfeeding support groups, community resources, and our phone # for post-discharge questions.    Patient Name: Alicia Cardenas [id number]'BToday's Date: 07/04/2014 Reason for consult: Initial assessment   Maternal Data Has patient been taught Hand Expression?: Yes Does the patient have breastfeeding experience prior to this delivery?: No (P3, first time bf)  Feeding Feeding Type: Breast Fed Length of feed: 10 min  LATCH Score/Interventions Latch: Repeated attempts needed to sustain latch, nipple held in mouth throughout feeding, stimulation needed to elicit sucking reflex. Intervention(s): Assist with latch  Audible Swallowing: Spontaneous and intermittent  Type of Nipple: Everted at rest and after stimulation  Comfort (Breast/Nipple): Soft / non-tender     Hold (Positioning): Assistance needed to correctly position infant at breast and maintain latch.  LATCH Score: 8  Lactation Tools Discussed/Used     Consult Status Consult Status: Follow-up Date: 07/05/14 Follow-up type: In-patient    Dahlia ByesBerkelhammer, Natnael Biederman [facility] 07/04/2014, 1:14 PM

## 2014-07-04 NOTE — Progress Notes (Signed)
Subjective: [facility] chart reviewed. Noted that patient desires BTL with papers signed at 34 weeks.  In room to address desire for sterilization.  Patient denies other concerns or needs at this time.   Objective:  Filed Vitals:   07/04/14 1046 07/04/14 1130 07/04/14 1220 07/04/14 1648  BP: 124/63 124/58 116/65 123/65  Pulse: 73 80 77 64  Temp:  98.3 F (36.8 C) 97.9 F (36.6 C) 98 F (36.7 C)  TempSrc:  Oral Oral Oral  Resp:  18 18 18   Height:      Weight:        Assessment: PP Day 0 S/P SVD Desires Permanent Sterilization  Plan: Consent located and printed from [facility] chart-Valid as of 07/03/2014 Dr. Katharine LookJ. Ozan contacted and informed of patient desire for BTL Lovenox dosage for tonight discontinued Patient NPO at midnight BTL scheduled for Nestor Lewandowsky8am  J. Zaden Sako, CNM 07/04/2014 8:20 PM

## 2014-07-05 ENCOUNTER — Inpatient Hospital Stay (HOSPITAL_COMMUNITY): Payer: Medicaid Other | Admitting: Anesthesiology

## 2014-07-05 ENCOUNTER — Encounter (HOSPITAL_COMMUNITY)
Admission: AD | Disposition: A | Discharge status: Home / Self Care | Payer: Self-pay | Source: Ambulatory Visit | Attending: Obstetrics and Gynecology

## 2014-07-05 HISTORY — PX: TUBAL LIGATION: SHX77

## 2014-07-05 LAB — CBC
HEMATOCRIT: 33.2 % — AB (ref 36.0–46.0)
Hemoglobin: 11.3 g/dL — ABNORMAL LOW (ref 12.0–15.0)
MCH: 31.4 pg (ref 26.0–34.0)
MCHC: 34 g/dL (ref 30.0–36.0)
MCV: 92.2 fL (ref 78.0–100.0)
Platelets: 169 10*3/uL (ref 150–400)
RBC: 3.6 MIL/uL — AB (ref 3.87–5.11)
RDW: 14.5 % (ref 11.5–15.5)
WBC: 10.3 10*3/uL (ref 4.0–10.5)

## 2014-07-05 LAB — RPR: RPR Ser Ql: NONREACTIVE

## 2014-07-05 SURGERY — LIGATION, FALLOPIAN TUBE, POSTPARTUM
Anesthesia: Spinal | Site: Abdomen | Laterality: Bilateral

## 2014-07-05 MED ORDER — FENTANYL CITRATE 0.05 MG/ML IJ SOLN
INTRAMUSCULAR | Status: AC
  Filled 2014-07-05: qty 2

## 2014-07-05 MED ORDER — ONDANSETRON HCL 4 MG/2ML IJ SOLN
INTRAMUSCULAR | Status: AC
  Filled 2014-07-05: qty 2

## 2014-07-05 MED ORDER — IBUPROFEN 600 MG PO TABS
ORAL_TABLET | ORAL | Status: AC
  Filled 2014-07-05: qty 1

## 2014-07-05 MED ORDER — MIDAZOLAM HCL 2 MG/2ML IJ SOLN
INTRAMUSCULAR | Status: AC
  Filled 2014-07-05: qty 2

## 2014-07-05 MED ORDER — ONDANSETRON HCL 4 MG/2ML IJ SOLN
INTRAMUSCULAR | Status: DC | PRN
  Administered 2014-07-05: 4 mg via INTRAVENOUS

## 2014-07-05 MED ORDER — ENOXAPARIN SODIUM 40 MG/0.4ML ~~LOC~~ SOLN
40.0000 mg | SUBCUTANEOUS | Status: DC
  Administered 2014-07-05: 40 mg via SUBCUTANEOUS
  Filled 2014-07-05 (×2): qty 0.4

## 2014-07-05 MED ORDER — CEFAZOLIN SODIUM-DEXTROSE 2-3 GM-% IV SOLR
INTRAVENOUS | Status: DC | PRN
  Administered 2014-07-05: 2 g via INTRAVENOUS

## 2014-07-05 MED ORDER — FENTANYL CITRATE 0.05 MG/ML IJ SOLN
INTRAMUSCULAR | Status: DC | PRN
  Administered 2014-07-05 (×2): 50 ug via INTRAVENOUS

## 2014-07-05 MED ORDER — BUPIVACAINE HCL (PF) 0.25 % IJ SOLN
INTRAMUSCULAR | Status: DC | PRN
  Administered 2014-07-05: 10 mL

## 2014-07-05 MED ORDER — CEFAZOLIN SODIUM-DEXTROSE 2-3 GM-% IV SOLR
INTRAVENOUS | Status: AC
  Filled 2014-07-05: qty 50

## 2014-07-05 MED ORDER — MIDAZOLAM HCL 2 MG/2ML IJ SOLN
INTRAMUSCULAR | Status: DC | PRN
  Administered 2014-07-05 (×2): 1 mg via INTRAVENOUS

## 2014-07-05 SURGICAL SUPPLY — 29 items
CLOTH BEACON ORANGE TIMEOUT ST (SAFETY) ×3 IMPLANT
CONTAINER PREFILL 10% NBF 15ML (MISCELLANEOUS) ×6 IMPLANT
DRSG OPSITE POSTOP 3X4 (GAUZE/BANDAGES/DRESSINGS) ×3 IMPLANT
ELECT REM PT RETURN 9FT ADLT (ELECTROSURGICAL) ×3
ELECTRODE REM PT RTRN 9FT ADLT (ELECTROSURGICAL) ×1 IMPLANT
GLOVE BIOGEL M 6.5 STRL (GLOVE) ×3 IMPLANT
GLOVE BIOGEL PI IND STRL 6.5 (GLOVE) ×1 IMPLANT
GLOVE BIOGEL PI INDICATOR 6.5 (GLOVE) ×2
GLOVE ECLIPSE 6.5 STRL STRAW (GLOVE) ×3 IMPLANT
GOWN STRL REUS W/TWL LRG LVL3 (GOWN DISPOSABLE) ×6 IMPLANT
LIQUID BAND (GAUZE/BANDAGES/DRESSINGS) ×3 IMPLANT
NDL HYPO 25X1 1.5 SAFETY (NEEDLE) ×1 IMPLANT
NEEDLE HYPO 25X1 1.5 SAFETY (NEEDLE) ×3 IMPLANT
NS IRRIG 1000ML POUR BTL (IV SOLUTION) ×3 IMPLANT
PACK ABDOMINAL MINOR (CUSTOM PROCEDURE TRAY) ×3 IMPLANT
PENCIL BUTTON HOLSTER BLD 10FT (ELECTRODE) ×3 IMPLANT
SPONGE LAP 4X18 X RAY DECT (DISPOSABLE) ×2 IMPLANT
SUT PLAIN 0 NONE (SUTURE) ×2 IMPLANT
SUT VIC AB 0 CT1 18XCR BRD8 (SUTURE) ×1 IMPLANT
SUT VIC AB 0 CT1 27 (SUTURE) ×3
SUT VIC AB 0 CT1 27XBRD ANBCTR (SUTURE) IMPLANT
SUT VIC AB 0 CT1 8-18 (SUTURE)
SUT VIC AB 0 CT2 27 (SUTURE) IMPLANT
SUT VICRYL 0 UR6 27IN ABS (SUTURE) ×1 IMPLANT
SUT VICRYL 4-0 PS2 18IN ABS (SUTURE) ×3 IMPLANT
SYR CONTROL 10ML LL (SYRINGE) ×3 IMPLANT
TOWEL OR 17X24 6PK STRL BLUE (TOWEL DISPOSABLE) ×6 IMPLANT
TRAY FOLEY CATH 14FR (SET/KITS/TRAYS/PACK) ×3 IMPLANT
WATER STERILE IRR 1000ML POUR (IV SOLUTION) ×3 IMPLANT

## 2014-07-05 NOTE — Transfer of Care (Signed)
Immediate Anesthesia Transfer of Care Note  Patient: Alicia Cardenas  Procedure(s) Performed: Procedure(s): POST PARTUM TUBAL LIGATION (Bilateral)  Patient Location: PACU  Anesthesia Type:Spinal  Level of Consciousness: awake  Airway & Oxygen Therapy: Patient Spontanous Breathing  Post-op Assessment: Report given to RN  Post vital signs: Reviewed and stable  Last Vitals:  Filed Vitals:   07/05/14 0755  BP: 105/57  Pulse: 88  Temp: 36.8 C  Resp: 18    Complications: No apparent anesthesia complications

## 2014-07-05 NOTE — Progress Notes (Signed)
TC from RN to report patient has mild HR arrhythmia, with "missed beats", but is asymptomatic. Patient denies chest pain or SOB. VSS, with O2 sat at 100%. Had BTL today--no report from Anesthesia of any arrhythmia, with note of NSR on PACU assessment. Lovenox 40 mg SQ restarted today at 2130.  Will continue to observe.  Nigel BridgemanVicki Brenna Friesenhahn, CNM 07/05/14 11:05p

## 2014-07-05 NOTE — Progress Notes (Signed)
Addendum Pt report feeling ok, she has a little pain and last took pain meds around noon when she got back to the room.  She stayed in PACU longer than usual bc she didn't regain feeling in her legs.  She report only in the last hour has she been about to really feel her legs.  She was encouraged to call the nurse if she need assistance with ambulating to the bathroom.  Honeycomb dressing CDI.

## 2014-07-05 NOTE — Progress Notes (Signed)
Late entry for 0700- consent obtained this am on error was entered in wrong chart:  Pt was consented this am prior to surgery.  Patient desires permanent sterilization.   Bilateral tubal ligation reviewed with R&B including but not limited to bleeding, infection, injury to other organs, irreversibility and failure rate of 1/500-05/998. Risk of irregular cycles was also reviewed, question and concerns were answered and inform consent was obtained.  Myna HidalgoJennifer Bayden Gil, DO [phone] (pager) [phone] (office)

## 2014-07-05 NOTE — Anesthesia Procedure Notes (Signed)
Spinal  Patient location during procedure: OR Preanesthetic Checklist Completed: patient identified, site marked, surgical consent, pre-op evaluation, timeout performed, IV checked, risks and benefits discussed and monitors and equipment checked Spinal Block Patient position: sitting Prep: DuraPrep Patient monitoring: heart rate, cardiac monitor, continuous pulse ox and blood pressure Approach: midline Location: L3-4 Injection technique: single-shot Needle Needle type: Sprotte  Needle gauge: 24 G Needle length: 9 cm Assessment Sensory level: T6 Additional Notes Spinal Dosage in OR  Bupivicaine ml       1.6      

## 2014-07-05 NOTE — Discharge Summary (Signed)
Vaginal Delivery Discharge Summary  ALL information will be verified prior to discharge  Alicia Cardenas  DOB:    June 14, 1980 MRN:    [id number] CSN:    [id number]  Date of admission:                  07/04/14  Date of discharge:                   07/06/14  Procedures this admission: SVD  Date of Delivery: 07/04/14  Newborn Data:  Live born  Information for the patient's newborn:  Alicia, Cardenas [[id number]]  female   Live born female  Birth Weight: 7 lb 8.3 oz (3410 g) APGAR: 8, 9  Home with mother. Name: Alicia Cardenas   History of Present Illness: Alicia Cardenas is a 34 y.o. female, G3P3003, who presents at [redacted]w[redacted]d weeks gestation. The patient has been followed at the [facility] and Gynecology division of [facility]. She was admitted onset of labor. Her pregnancy has been complicated by:  Patient Active Problem List   Diagnosis Date Noted  . Vaginal delivery 07/05/2014  . Vaginal bleeding during pregnancy, antepartum 03/30/2014  . Asthma, chronic 03/30/2014  . Penicillin allergy 03/30/2014  . Pain in symphysis pubis during pregnancy 03/30/2014  . History of pulmonary embolus during pregnancy 12/30/2013    Hospital course: The patient was admitted for labor.   Her labor was not complicated. She proceeded to have a vaginal delivery of a healthy infant. Her delivery was not complicated. Her postpartum course was not complicated. She was discharged to home on postpartum day 2 doing well.  Arrhythmia noted, cardiology consulted ordered.   Assessment by Dr Cassell Clement: History of arrhythmia since teenage years. Rhythm currently regular on bedside exam. EKG just now done and shows marked sinus arrhythmia but no pathologic rhyhm.    Feeding: breast  Contraception: bilateral tubal ligation   Discharge hemoglobin: HEMOGLOBIN  Date Value Ref Range Status  07/05/2014 11.3* 12.0 - 15.0 g/dL Final   HCT  Date Value Ref Range Status    07/05/2014 33.2* 36.0 - 46.0 % Final    PreNatal Labs ABO, Rh: --/--/A POS (02/13 0430)   Antibody: NEG (02/13 0430) Rubella:   immune RPR: Non Reactive (02/13 0430)  HBsAg: Negative (07/30 0000)  HIV: Non-reactive (07/30 0000)  GBS: Negative (01/28 0000)  Discharge Physical Exam:  General: alert and cooperative Lochia: appropriate Uterine Fundus: firm Incision: healing well DVT Evaluation: No evidence of DVT seen on physical exam.  Intrapartum Procedures: spontaneous vaginal delivery Postpartum Procedures: none and P.P. tubal ligation Complications-Operative and Postpartum: none  Discharge Diagnoses: Term Pregnancy-delivered, BTL, Chronic cardiac arrhythmia,  Activity:           pelvic rest Diet:                routine Medications: PNV, Iron and Percocet Condition:      stable     Postpartum Teaching: Nutrition, exercise, return to work or school, family visits, sexual activity, home rest, vaginal bleeding, pelvic rest, family planning, s/s of PPD, breast care peri-care and incision care  Cardiology recommendation This is a benign rhythm. Does not require any treatment. Okay to discharge home from cardiac standpoint. Signed,  Cassell Clement, MD  07/06/2014, 1:45 PM  Discharge to: home    Follow-up Information    Follow up with [facility] In 6 weeks.   Specialty:  Obstetrics and Gynecology  Why:  Postpartum check up, Call with any questions or concerns   Contact information:   [address] [place] [id number] [phone]       Adelina MingsVenus Shalisa Mcquade, CNM, MSN 07/06/2014. 2:34 PM   Postpartum Care After Vaginal Delivery  After you deliver your newborn (postpartum period), the usual stay in the hospital is 24 72 hours. If there were problems with your labor or delivery, or if you have other medical problems, you might be in the hospital longer.  While you are in the hospital, you will receive  help and instructions on how to care for yourself and your newborn during the postpartum period.  While you are in the hospital:  Be sure to tell your nurses if you have pain or discomfort, as well as where you feel the pain and what makes the pain worse.  If you had an incision made near your vagina (episiotomy) or if you had some tearing during delivery, the nurses may put ice packs on your episiotomy or tear. The ice packs may help to reduce the pain and swelling.  If you are breastfeeding, you may feel uncomfortable contractions of your uterus for a couple of weeks. This is normal. The contractions help your uterus get back to normal size.  It is normal to have some bleeding after delivery.  For the first 1 3 days after delivery, the flow is red and the amount may be similar to a period.  It is common for the flow to start and stop.  In the first few days, you may pass some small clots. Let your nurses know if you begin to pass large clots or your flow increases.  Do not  flush blood clots down the toilet before having the nurse look at them.  During the next 3 10 days after delivery, your flow should become more watery and pink or brown-tinged in color.  Ten to fourteen days after delivery, your flow should be a small amount of yellowish-white discharge.  The amount of your flow will decrease over the first few weeks after delivery. Your flow may stop in 6 8 weeks. Most women have had their flow stop by 12 weeks after delivery.  You should change your sanitary pads frequently.  Wash your hands thoroughly with soap and water for at least 20 seconds after changing pads, using the toilet, or before holding or feeding your newborn.  You should feel like you need to empty your bladder within the first 6 8 hours after delivery.  In case you become weak, lightheaded, or faint, call your nurse before you get out of bed for the first time and before you take a shower for the first  time.  Within the first few days after delivery, your breasts may begin to feel tender and full. This is called engorgement. Breast tenderness usually goes away within 48 72 hours after engorgement occurs. You may also notice milk leaking from your breasts. If you are not breastfeeding, do not stimulate your breasts. Breast stimulation can make your breasts produce more milk.  Spending as much time as possible with your newborn is very important. During this time, you and your newborn can feel close and get to know each other. Having your newborn stay in your room (rooming in) will help to strengthen the bond with your newborn. It will give you time to get to know your newborn and become comfortable caring for your newborn.  Your hormones change after delivery.  Sometimes the hormone changes can temporarily cause you to feel sad or tearful. These feelings should not last more than a few days. If these feelings last longer than that, you should talk to your caregiver.  If desired, talk to your caregiver about methods of family planning or contraception.  Talk to your caregiver about immunizations. Your caregiver may want you to have the following immunizations before leaving the hospital:  Tetanus, diphtheria, and pertussis (Tdap) or tetanus and diphtheria (Td) immunization. It is very important that you and your family (including grandparents) or others caring for your newborn are up-to-date with the Tdap or Td immunizations. The Tdap or Td immunization can help protect your newborn from getting ill.  Rubella immunization.  Varicella (chickenpox) immunization.  Influenza immunization. You should receive this annual immunization if you did not receive the immunization during your pregnancy. Document Released: 03/05/2007 Document Revised: 01/31/2012 Document Reviewed: 01/03/2012 [facility] Patient Information 2014 [place], [place].   Postpartum Depression and Baby Blues  The postpartum period  begins right after the birth of a baby. During this time, there is often a great amount of joy and excitement. It is also a time of considerable changes in the life of the parent(s). Regardless of how many times a mother gives birth, each child brings new challenges and dynamics to the family. It is not unusual to have feelings of excitement accompanied by confusing shifts in moods, emotions, and thoughts. All mothers are at risk of developing postpartum depression or the "baby blues." These mood changes can occur right after giving birth, or they may occur many months after giving birth. The baby blues or postpartum depression can be mild or severe. Additionally, postpartum depression can resolve rather quickly, or it can be a long-term condition. CAUSES Elevated hormones and their rapid decline are thought to be a main cause of postpartum depression and the baby blues. There are a number of hormones that radically change during and after pregnancy. Estrogen and progesterone usually decrease immediately after delivering your baby. The level of thyroid hormone and various cortisol steroids also rapidly drop. Other factors that play a major role in these changes include major life events and genetics.  RISK FACTORS If you have any of the following risks for the baby blues or postpartum depression, know what symptoms to watch out for during the postpartum period. Risk factors that may increase the likelihood of getting the baby blues or postpartum depression include: 1. Havinga personal or family history of depression. 2. Having depression while being pregnant. 3. Having premenstrual or oral contraceptive-associated mood issues. 4. Having exceptional life stress. 5. Having marital conflict. 6. Lacking a social support network. 7. Having a baby with special needs. 8. Having health problems such as diabetes. SYMPTOMS Baby blues symptoms include:  Brief fluctuations in mood, such as going from extreme  happiness to sadness.  Decreased concentration.  Difficulty sleeping.  Crying spells, tearfulness.  Irritability.  Anxiety. Postpartum depression symptoms typically begin within the first month after giving birth. These symptoms include:  Difficulty sleeping or excessive sleepiness.  Marked weight loss.  Agitation.  Feelings of worthlessness.  Lack of interest in activity or food. Postpartum psychosis is a very concerning condition and can be dangerous. Fortunately, it is rare. Displaying any of the following symptoms is cause for immediate medical attention. Postpartum psychosis symptoms include:  Hallucinations and delusions.  Bizarre or disorganized behavior.  Confusion or disorientation. DIAGNOSIS  A diagnosis is made by an evaluation of your symptoms. There are  no medical or lab tests that lead to a diagnosis, but there are various questionnaires that a caregiver may use to identify those with the baby blues, postpartum depression, or psychosis. Often times, a screening tool called the [place] Postnatal Depression Scale is used to diagnose depression in the postpartum period.  TREATMENT The baby blues usually goes away on its own in 1 to 2 weeks. Social support is often all that is needed. You should be encouraged to get adequate sleep and rest. Occasionally, you may be given medicines to help you sleep.  Postpartum depression requires treatment as it can last several months or longer if it is not treated. Treatment may include individual or group therapy, medicine, or both to address any social, physiological, and psychological factors that may play a role in the depression. Regular exercise, a healthy diet, rest, and social support may also be strongly recommended.  Postpartum psychosis is more serious and needs treatment right away. Hospitalization is often needed. HOME CARE INSTRUCTIONS  Get as much rest as you can. Nap when the baby sleeps.  Exercise regularly. Some  women find yoga and walking to be beneficial.  Eat a balanced and nourishing diet.  Do little things that you enjoy. Have a cup of tea, take a bubble bath, read your favorite magazine, or listen to your favorite music.  Avoid alcohol.  Ask for help with household chores, cooking, grocery shopping, or running errands as needed. Do not try to do everything.  Talk to people close to you about how you are feeling. Get support from your partner, family members, friends, or other new moms.  Try to stay positive in how you think. Think about the things you are grateful for.  Do not spend a lot of time alone.  Only take medicine as directed by your caregiver.  Keep all your postpartum appointments.  Let your caregiver know if you have any concerns. SEEK MEDICAL CARE IF: You are having a reaction or problems with your medicine. SEEK IMMEDIATE MEDICAL CARE IF:  You have suicidal feelings.  You feel you may harm the baby or someone else. Document Released: 02/10/2004 Document Revised: 07/31/2011 Document Reviewed: 03/14/2011 [facility] Patient Information 2014 [place], [place].     Breastfeeding Deciding to breastfeed is one of the best choices you can make for you and your baby. A change in hormones during pregnancy causes your breast tissue to grow and increases the number and size of your milk ducts. These hormones also allow proteins, sugars, and fats from your blood supply to make breast milk in your milk-producing glands. Hormones prevent breast milk from being released before your baby is born as well as prompt milk flow after birth. Once breastfeeding has begun, thoughts of your baby, as well as his or her sucking or crying, can stimulate the release of milk from your milk-producing glands.  BENEFITS OF BREASTFEEDING For Your Baby  Your first milk (colostrum) helps your baby's digestive system function better.   There are antibodies in your milk that help your baby fight off  infections.   Your baby has a lower incidence of asthma, allergies, and sudden infant death syndrome.   The nutrients in breast milk are better for your baby than infant formulas and are designed uniquely for your baby's needs.   Breast milk improves your baby's brain development.   Your baby is less likely to develop other conditions, such as childhood obesity, asthma, or type 2 diabetes mellitus.  For You   Breastfeeding  helps to create a very special bond between you and your baby.   Breastfeeding is convenient. Breast milk is always available at the correct temperature and costs nothing.   Breastfeeding helps to burn calories and helps you lose the weight gained during pregnancy.   Breastfeeding makes your uterus contract to its prepregnancy size faster and slows bleeding (lochia) after you give birth.   Breastfeeding helps to lower your risk of developing type 2 diabetes mellitus, osteoporosis, and breast or ovarian cancer later in life. SIGNS THAT YOUR BABY IS HUNGRY Early Signs of Hunger  Increased alertness or activity.  Stretching.  Movement of the head from side to side.  Movement of the head and opening of the mouth when the corner of the mouth or cheek is stroked (rooting).  Increased sucking sounds, smacking lips, cooing, sighing, or squeaking.  Hand-to-mouth movements.  Increased sucking of fingers or hands. Late Signs of Hunger  Fussing.  Intermittent crying. Extreme Signs of Hunger Signs of extreme hunger will require calming and consoling before your baby will be able to breastfeed successfully. Do not wait for the following signs of extreme hunger to occur before you initiate breastfeeding:   Restlessness.  A loud, strong cry.   Screaming.   BREASTFEEDING BASICS Breastfeeding Initiation  Find a comfortable place to sit or lie down, with your neck and back well supported.  Place a pillow or rolled up blanket under your baby to bring  him or her to the level of your breast (if you are seated). Nursing pillows are specially designed to help support your arms and your baby while you breastfeed.  Make sure that your baby's abdomen is facing your abdomen.   Gently massage your breast. With your fingertips, massage from your chest wall toward your nipple in a circular motion. This encourages milk flow. You may need to continue this action during the feeding if your milk flows slowly.  Support your breast with 4 fingers underneath and your thumb above your nipple. Make sure your fingers are well away from your nipple and your baby's mouth.   Stroke your baby's lips gently with your finger or nipple.   When your baby's mouth is open wide enough, quickly bring your baby to your breast, placing your entire nipple and as much of the colored area around your nipple (areola) as possible into your baby's mouth.   More areola should be visible above your baby's upper lip than below the lower lip.   Your baby's tongue should be between his or her lower gum and your breast.   Ensure that your baby's mouth is correctly positioned around your nipple (latched). Your baby's lips should create a seal on your breast and be turned out (everted).  It is common for your baby to suck about 2-3 minutes in order to start the flow of breast milk. Latching Teaching your baby how to latch on to your breast properly is very important. An improper latch can cause nipple pain and decreased milk supply for you and poor weight gain in your baby. Also, if your baby is not latched onto your nipple properly, he or she may swallow some air during feeding. This can make your baby fussy. Burping your baby when you switch breasts during the feeding can help to get rid of the air. However, teaching your baby to latch on properly is still the best way to prevent fussiness from swallowing air while breastfeeding. Signs that your baby has successfully latched on to  your nipple:    Silent tugging or silent sucking, without causing you pain.   Swallowing heard between every 3-4 sucks.    Muscle movement above and in front of his or her ears while sucking.  Signs that your baby has not successfully latched on to nipple:   Sucking sounds or smacking sounds from your baby while breastfeeding.  Nipple pain. If you think your baby has not latched on correctly, slip your finger into the corner of your baby's mouth to break the suction and place it between your baby's gums. Attempt breastfeeding initiation again. Signs of Successful Breastfeeding Signs from your baby:   A gradual decrease in the number of sucks or complete cessation of sucking.   Falling asleep.   Relaxation of his or her body.   Retention of a small amount of milk in his or her mouth.   Letting go of your breast by himself or herself. Signs from you:  Breasts that have increased in firmness, weight, and size 1-3 hours after feeding.   Breasts that are softer immediately after breastfeeding.  Increased milk volume, as well as a change in milk consistency and color by the fifth day of breastfeeding.   Nipples that are not sore, cracked, or bleeding. Signs That Your Pecola Leisure is Getting Enough Milk  Wetting at least 3 diapers in a 24-hour period. The urine should be clear and pale yellow by age 33 days.  At least 3 stools in a 24-hour period by age 33 days. The stool should be soft and yellow.  At least 3 stools in a 24-hour period by age 96 days. The stool should be seedy and yellow.  No loss of weight greater than 10% of birth weight during the first 14 days of age.  Average weight gain of 4-7 ounces (113-198 g) per week after age 19 days.  Consistent daily weight gain by age 33 days, without weight loss after the age of 2 weeks. After a feeding, your baby may spit up a small amount. This is common. BREASTFEEDING FREQUENCY AND DURATION Frequent feeding will help you make  more milk and can prevent sore nipples and breast engorgement. Breastfeed when you feel the need to reduce the fullness of your breasts or when your baby shows signs of hunger. This is called "breastfeeding on demand." Avoid introducing a pacifier to your baby while you are working to establish breastfeeding (the first 4-6 weeks after your baby is born). After this time you may choose to use a pacifier. Research has shown that pacifier use during the first year of a baby's life decreases the risk of sudden infant death syndrome (SIDS). Allow your baby to feed on each breast as long as he or she wants. Breastfeed until your baby is finished feeding. When your baby unlatches or falls asleep while feeding from the first breast, offer the second breast. Because newborns are often sleepy in the first few weeks of life, you may need to awaken your baby to get him or her to feed. Breastfeeding times will vary from baby to baby. However, the following rules can serve as a guide to help you ensure that your baby is properly fed:  Newborns (babies 20 weeks of age or younger) may breastfeed every 1-3 hours.  Newborns should not go longer than 3 hours during the day or 5 hours during the night without breastfeeding.  You should breastfeed your baby a minimum of 8 times in a 24-hour period until you begin to  introduce solid foods to your baby at around 34 months of age. BREAST MILK PUMPING Pumping and storing breast milk allows you to ensure that your baby is exclusively fed your breast milk, even at times when you are unable to breastfeed. This is especially important if you are going back to work while you are still breastfeeding or when you are not able to be present during feedings. Your lactation consultant can give you guidelines on how long it is safe to store breast milk.  A breast pump is a machine that allows you to pump milk from your breast into a sterile bottle. The pumped breast milk can then be stored in  a refrigerator or freezer. Some breast pumps are operated by hand, while others use electricity. Ask your lactation consultant which type will work best for you. Breast pumps can be purchased, but some hospitals and breastfeeding support groups lease breast pumps on a monthly basis. A lactation consultant can teach you how to hand express breast milk, if you prefer not to use a pump.  CARING FOR YOUR BREASTS WHILE YOU BREASTFEED Nipples can become dry, cracked, and sore while breastfeeding. The following recommendations can help keep your breasts moisturized and healthy:  Avoid using soap on your nipples.   Wear a supportive bra. Although not required, special nursing bras and tank tops are designed to allow access to your breasts for breastfeeding without taking off your entire bra or top. Avoid wearing underwire-style bras or extremely tight bras.  Air dry your nipples for 3-58minutes after each feeding.   Use only cotton bra pads to absorb leaked breast milk. Leaking of breast milk between feedings is normal.   Use lanolin on your nipples after breastfeeding. Lanolin helps to maintain your skin's normal moisture barrier. If you use pure lanolin, you do not need to wash it off before feeding your baby again. Pure lanolin is not toxic to your baby. You may also hand express a few drops of breast milk and gently massage that milk into your nipples and allow the milk to air dry. In the first few weeks after giving birth, some women experience extremely full breasts (engorgement). Engorgement can make your breasts feel heavy, warm, and tender to the touch. Engorgement peaks within 3-5 days after you give birth. The following recommendations can help ease engorgement:  Completely empty your breasts while breastfeeding or pumping. You may want to start by applying warm, moist heat (in the shower or with warm water-soaked hand towels) just before feeding or pumping. This increases circulation and helps  the milk flow. If your baby does not completely empty your breasts while breastfeeding, pump any extra milk after he or she is finished.  Wear a snug bra (nursing or regular) or tank top for 1-2 days to signal your body to slightly decrease milk production.  Apply ice packs to your breasts, unless this is too uncomfortable for you.  Make sure that your baby is latched on and positioned properly while breastfeeding. If engorgement persists after 48 hours of following these recommendations, contact your health care provider or a Advertising copywriter. OVERALL HEALTH CARE RECOMMENDATIONS WHILE BREASTFEEDING  Eat healthy foods. Alternate between meals and snacks, eating 3 of each per day. Because what you eat affects your breast milk, some of the foods may make your baby more irritable than usual. Avoid eating these foods if you are sure that they are negatively affecting your baby.  Drink milk, fruit juice, and water to satisfy your  thirst (about 10 glasses a day).   Rest often, relax, and continue to take your prenatal vitamins to prevent fatigue, stress, and anemia.  Continue breast self-awareness checks.  Avoid chewing and smoking tobacco.  Avoid alcohol and drug use. Some medicines that may be harmful to your baby can pass through breast milk. It is important to ask your health care provider before taking any medicine, including all over-the-counter and prescription medicine as well as vitamin and herbal supplements. It is possible to become pregnant while breastfeeding. If birth control is desired, ask your health care provider about options that will be safe for your baby. SEEK MEDICAL CARE IF:   You feel like you want to stop breastfeeding or have become frustrated with breastfeeding.  You have painful breasts or nipples.  Your nipples are cracked or bleeding.  Your breasts are red, tender, or warm.  You have a swollen area on either breast.  You have a fever or chills.  You  have nausea or vomiting.  You have drainage other than breast milk from your nipples.  Your breasts do not become full before feedings by the fifth day after you give birth.  You feel sad and depressed.  Your baby is too sleepy to eat well.  Your baby is having trouble sleeping.   Your baby is wetting less than 3 diapers in a 24-hour period.  Your baby has less than 3 stools in a 24-hour period.  Your baby's skin or the white part of his or her eyes becomes yellow.   Your baby is not gaining weight by 78 days of age. SEEK IMMEDIATE MEDICAL CARE IF:   Your baby is overly tired (lethargic) and does not want to wake up and feed.  Your baby develops an unexplained fever. Document Released: 05/08/2005 Document Revised: 05/13/2013 Document Reviewed: 10/30/2012 [facility] Patient Information 2015 [place], [place]. This information is not intended to replace advice given to you by your health care provider. Make sure you discuss any questions you have with your health care provider.

## 2014-07-05 NOTE — Progress Notes (Signed)
Upon auscultation of heart sounds, regular missed beat noted.  Asymptomatic, vital signs including O2 sat WNL. I called and spoke by phone with Nigel BridgemanVicki Latham, CNM to inform. No new orders at this time, continue to monitor.  Wolfgang PhoenixLeigha Joyceline Maiorino RN 07/05/14 2300

## 2014-07-05 NOTE — Anesthesia Postprocedure Evaluation (Signed)
  Anesthesia Post-op Note  Patient: Alicia Cardenas  Procedure(s) Performed: Procedure(s): POST PARTUM TUBAL LIGATION (Bilateral)  Patient Location: Mother/Baby  Anesthesia Type:Spinal  Level of Consciousness: awake  Airway and Oxygen Therapy: Patient Spontanous Breathing  Post-op Pain: mild  Post-op Assessment: Patient's Cardiovascular Status Stable and Respiratory Function Stable  Post-op Vital Signs: stable  Last Vitals:  Filed Vitals:   07/05/14 1340  BP: 114/71  Pulse: 72  Temp: 36.6 C  Resp: 18    Complications: No apparent anesthesia complications

## 2014-07-05 NOTE — Anesthesia Preprocedure Evaluation (Addendum)
Anesthesia Evaluation  Patient identified by MRN, date of birth, ID band Patient awake    Reviewed: Allergy & Precautions, H&P , Patient's Chart, lab work & pertinent test results  Airway Mallampati: II  TM Distance: >3 FB Neck ROM: full    Dental no notable dental hx.    Pulmonary asthma ,  breath sounds clear to auscultation  Pulmonary exam normal       Cardiovascular Exercise Tolerance: Good Rhythm:regular Rate:Normal     Neuro/Psych    GI/Hepatic   Endo/Other  Morbid obesity  Renal/GU      Musculoskeletal   Abdominal   Peds  Hematology   Anesthesia Other Findings Hx of PE; confirmed with Pharmacy that Lovenox was d/c'd and no dose charted as administered since delivery  Reproductive/Obstetrics                           Anesthesia Physical Anesthesia Plan  ASA: III  Anesthesia Plan: Spinal   Post-op Pain Management:    Induction:   Airway Management Planned:   Additional Equipment:   Intra-op Plan:   Post-operative Plan:   Informed Consent: I have reviewed the patients History and Physical, chart, labs and discussed the procedure including the risks, benefits and alternatives for the proposed anesthesia with the patient or authorized representative who has indicated his/her understanding and acceptance.   Dental Advisory Given  Plan Discussed with: CRNA  Anesthesia Plan Comments: (Lab work confirmed with CRNA in room. Platelets okay. Discussed spinal anesthetic, and patient consents to the procedure:  included risk of possible headache,backache, failed block, allergic reaction, and nerve injury. This patient was asked if she had any questions or concerns before the procedure started. )        Anesthesia Quick Evaluation

## 2014-07-05 NOTE — Lactation Note (Signed)
This note was copied from the chart of Girl Ivar BuryJenice Purdon. Lactation Consultation Note  Patient Name: Girl Ivar BuryJenice Furtick [id number]'ZToday's Date: 07/05/2014 Reason for consult: Follow-up assessment   Follow-up at 1633 hours old; Infant has breastfed x3 (10-30 min) + formula x5 (5-15 ml) + attempts x2 in past 24 hours; voids-3; stools-5 in past 24 hours.  LS-6-7 by RN. Upon entering room, infant lying in crib crying and showing feeding cues.  Mom stated she just fed a bottle "because I have been in surgery today." Day of Life Guideline sheet given for supplementation and encouraged breastfeeding first prior to giving formula. Educated on feeding cues, cluster feeding, milk supply and production supply/demand.  Encouraged mom to latch infant since she was showing feeding cues.   Mom independently latched infant with depth and flanged lips on right side in cradle hold.  Infant sucked with a rhythmic sucking pattern.  LS-9.   Reviewed basics of breastfeeding and pointed out characteristics of current latch that was good and would facilitate milk transfer. Encouraged mom to continue offering breast first with feedings.  Encouraged to call for assistance if needed.      Maternal Data    Feeding Feeding Type: Breast Fed  LATCH Score/Interventions Latch: Grasps breast easily, tongue down, lips flanged, rhythmical sucking.  Audible Swallowing: A few with stimulation  Type of Nipple: Everted at rest and after stimulation  Comfort (Breast/Nipple): Soft / non-tender     Hold (Positioning): No assistance needed to correctly position infant at breast.  LATCH Score: 9  Lactation Tools Discussed/Used     Consult Status Consult Status: Follow-up Date: 07/06/14 Follow-up type: In-patient    Lendon KaVann, Roquel Burgin Walker 07/05/2014, 6:32 PM

## 2014-07-05 NOTE — Op Note (Addendum)
Preop diagnosis: Desire for sterilization  Postop diagnosis: Same  Anesthesia: Epidural  Procedure: Postpartum bilateral tubal ligation  Surgeon: Dr. Charlotta Newtonzan  Asst.: None  Estimated blood loss: 5mL UOP: 50cc IVF: 1600cc  Procedure:  Patient was taken to the O.R where spinal anesthesia was given without any complication. She was placed in  dorsal decubitus position, prepped and draped in a sterile fashion. A Foley catheter was inserted.  A sub-umbilical incision was made with the scalpel and Mayo scissors were used down to the fascia. The fascia was identified and entered with Mayo scissors. Using Trendelenburg position, a moist tagged packing was placed. Attention was turned to the right side, where the right fallopian tube was  grasped with Babcock forceps and exteriorized until the fimbria was visualized.  A knuckle of tube was made on the right using a free tie and individual ties of 0 plain catgut suture were used.  The knuckle of tube was then excised. Hemostasis was achieved with the bovie. An identical procedure was carried out on the opposite side. Again hemostasis was adequate. The fascia and the anterior peritoneum were closed using a running suture of 0 Vicryl. The skin was reapproximated using a subcuticular suture of 4-0 Monocryl. 10cc of Marcaine was injected.  Sponge, needle, and instrument counts were correct. The procedure is well tolerated by the patient who is taken to recovery room in a well and stable condition.  Specimen: 2 segments of tube sent to pathology.  Alicia HidalgoJennifer Zamariah Seaborn, DO [phone] (pager) [phone] (office)

## 2014-07-06 ENCOUNTER — Encounter (HOSPITAL_COMMUNITY): Payer: Self-pay | Admitting: Obstetrics & Gynecology

## 2014-07-06 DIAGNOSIS — I498 Other specified cardiac arrhythmias: Secondary | ICD-10-CM

## 2014-07-06 MED ORDER — OXYCODONE-ACETAMINOPHEN 5-325 MG PO TABS: 1.0000 | ORAL_TABLET | ORAL | Status: DC | PRN

## 2014-07-06 NOTE — Progress Notes (Signed)
Addendum Just received a return page from [place].  She states that Dr Emmie NiemannBrackbrill will round on this patient this afternoon.

## 2014-07-06 NOTE — Consult Note (Signed)
CARDIOLOGY CONSULT NOTE   Patient ID: Alicia BuryJenice Cardenas MRN: [id number], DOB/AGE: [birth year]   Admit date: 07/04/2014 Date of Consult: 07/06/2014   Primary Physician: No PCP Per Patient Primary Cardiologist: None  Pt. Profile  34 year old woman day 2 vaginal delivery. Noted to have possible arrhythmia on exam this am.  Problem List  Past Medical History  Diagnosis Date  . PE (pulmonary embolism)   . Sickle cell trait   . Anemia   . Asthma     Diagnosed this pregnancy; hasn't used inhaler since Fall 2015    Past Surgical History  Procedure Laterality Date  . No past surgeries    . Tubal ligation Bilateral 07/05/2014    Procedure: POST PARTUM TUBAL LIGATION;  Surgeon: Sharon SellerJennifer M Ozan, DO;  Location: [facility];  Service: Gynecology;  Laterality: Bilateral;     Allergies  Allergies  Allergen Reactions  . Penicillins Other (See Comments)    Childhood     HPI   Patient states she has had an arrhythmia since she was a teenager.  The arrhythmia is intermittent.  She states that when she goes to the doctor it never seems to be found. She has not required any treatment for the heart rhythm problem. She has a history of pulmonary embolus during a previous pregnancy in 2009. During this pregnancy she has been on prophylactic lovenox injections until 37 weeks when she was switched to heparin. She denies any chest pain, dizziness, syncope or unusual dyspnea.  Inpatient Medications  . enoxaparin (LOVENOX) injection  40 mg Subcutaneous Q24H  . ibuprofen  600 mg Oral 4 times per day  . prenatal multivitamin  1 tablet Oral Q1200  . senna-docusate  2 tablet Oral Q24H  . Tdap  0.5 mL Intramuscular Once    Family History Family History  Problem Relation Age of Onset  . Diabetes Maternal Uncle   . Heart disease Maternal Uncle   . Hypertension Maternal Uncle   . Hearing loss Neg Hx      Social History History   Social History  . Marital Status: Single    Spouse Name: N/A  .  Number of Children: N/A  . Years of Education: N/A   Occupational History  . Not on file.   Social History Main Topics  . Smoking status: Never Smoker   . Smokeless tobacco: Never Used  . Alcohol Use: No  . Drug Use: No  . Sexual Activity: Yes    Birth Control/ Protection: None   Other Topics Concern  . Not on file   Social History Narrative     Review of Systems  General:  No chills, fever, night sweats or weight changes.  Cardiovascular:  No chest pain, dyspnea on exertion, edema, orthopnea, palpitations, paroxysmal nocturnal dyspnea. Dermatological: No rash, lesions/masses Respiratory: No cough, dyspnea Urologic: No hematuria, dysuria Abdominal:   No nausea, vomiting, diarrhea, bright red blood per rectum, melena, or hematemesis Neurologic:  No visual changes, wkns, changes in mental status. All other systems reviewed and are otherwise negative except as noted above.  Physical Exam  Blood pressure 118/54, pulse 74, temperature 98 F (36.7 C), temperature source Oral, resp. rate 18, height 5' 6.5" (1.689 m), weight 231 lb (104.781 kg), last menstrual period 10/07/2013, SpO2 100 %, unknown if currently breastfeeding.  General: Pleasant, NAD Psych: Normal affect. Neuro: Alert and oriented X 3. Moves all extremities spontaneously. HEENT: Normal  Neck: Supple without bruits or JVD. Lungs:  Resp regular and unlabored,  CTA.  Heart: RRR no s3, s4, or murmurs. Rhythm is regular on bedside exam at present. Abdomen: Soft, non-tender, non-distended, BS + x 4.  Extremities: No clubbing, cyanosis or edema. DP/PT/Radials 2+ and equal bilaterally.  Labs  No results for input(s): CKTOTAL, CKMB, TROPONINI in the last 72 hours. Lab Results  Component Value Date   WBC 10.3 07/05/2014   HGB 11.3* 07/05/2014   HCT 33.2* 07/05/2014   MCV 92.2 07/05/2014   PLT 169 07/05/2014   No results for input(s): NA, K, CL, CO2, BUN, CREATININE, CALCIUM, PROT, BILITOT, ALKPHOS, ALT, AST,  GLUCOSE in the last 168 hours.  Invalid input(s): LABALBU No results found for: CHOL, HDL, LDLCALC, TRIG Lab Results  Component Value Date   DDIMER  10/15/2009    0.31        AT THE INHOUSE ESTABLISHED CUTOFF VALUE OF 0.48 ug/mL FEU, THIS ASSAY HAS BEEN DOCUMENTED IN THE LITERATURE TO HAVE A SENSITIVITY AND NEGATIVE PREDICTIVE VALUE OF AT LEAST 98 TO 99%.  THE TEST RESULT SHOULD BE CORRELATED WITH AN ASSESSMENT OF THE CLINICAL PROBABILITY OF DVT / VTE.    Radiology/Studies  No results found.  ECG  NSR with marked sinus arrhythmia. Otherwise WNL  ASSESSMENT AND PLAN  1. Recent pregnancy and vaginal delivery and subsequent tubal ligation. 2. History of arrhythmia since teenage years. Rhythm currently regular on bedside exam. EKG just now done and shows marked sinus arrhythmia but no pathologic rhyhm. 3. Remote history of pulmonary embolus 2009  Recommendation: This is a benign rhythm. Does not require any treatment. Okay to discharge home from cardiac standpoint. Signed,  Cassell Clement, MD  07/06/2014, 1:45 PM

## 2014-07-06 NOTE — Lactation Note (Signed)
This note was copied from the chart of Alicia Cardenas Harbach. Lactation Consultation Note BF preferred in cradle position. Had good latch, encouraged more STS and not BF baby wrapped in blankets and hat. Noted breast filling. Encouraged to massage during BF to express more milk into baby's mouth during BF to get 50% more. Gave hand pump and discussed engorgement, deep latches, positioning, pumping, supplementing discouraged since mom had her breast filling, and pacifiers discouraged for 2 weeks. Reminded of OP services if needed. Reviewed amount of pee's and poop's at days of age in 62Baby and Me book, and keep up with out put and feedings to take to MD appt.  Mom denies having any further questions.  Patient Name: Alicia Cardenas Feliz [id number]'WToday's Date: 07/06/2014 Reason for consult: Follow-up assessment   Maternal Data    Feeding Feeding Type: Breast Fed  LATCH Score/Interventions Latch: Grasps breast easily, tongue down, lips flanged, rhythmical sucking. Intervention(s): Breast massage;Breast compression  Audible Swallowing: Spontaneous and intermittent Intervention(s): Hand expression Intervention(s): Alternate breast massage  Type of Nipple: Everted at rest and after stimulation  Comfort (Breast/Nipple): Filling, red/small blisters or bruises, mild/mod discomfort  Problem noted: Filling Interventions (Filling): Massage;Frequent nursing;Hand pump  Hold (Positioning): No assistance needed to correctly position infant at breast.  LATCH Score: 9  Lactation Tools Discussed/Used Pump Review: Setup, frequency, and cleaning;Milk Storage Initiated by:: Peri JeffersonL. Arayla Kruschke RN Date initiated:: 07/06/14   Consult Status Consult Status: Complete Date: 07/06/14    Charyl DancerCARVER, Charda Janis G 07/06/2014, 1:48 AM

## 2014-07-06 NOTE — Progress Notes (Signed)
UR chart review completed.  

## 2014-07-06 NOTE — Progress Notes (Signed)
Alicia Cardenas   Subjective: Post Partum Day 2 Vaginal delivery, no laceration Patient up ad lib, denies syncope or dizziness. Reports consuming regular diet without issues and denies N/V No issues with urination and reports bleeding is appropriate  Feeding:  breast Contraceptive plan:   BTL  Nurse reports she detected an Arrhythmia.  Pt state she was told that years ago but denies all s/s.    Objective: Temp:  [97.5 F (36.4 C)-98.2 F (36.8 C)] 97.7 F (36.5 C) (02/15 0500) Pulse Rate:  [57-83] 83 (02/15 0500) Resp:  [14-20] 18 (02/15 0500) BP: (107-130)/(52-72) 116/67 mmHg (02/15 0500) SpO2:  [97 %-100 %] 100 % (02/15 0500)   Filed Vitals:   07/05/14 1815 07/05/14 2135 07/06/14 0100 07/06/14 0500  BP: 120/52 107/59 119/61 116/67  Pulse: 80 80 73 83  Temp: 98.2 F (36.8 C) 98 F (36.7 C) 97.7 F (36.5 C) 97.7 F (36.5 C)  TempSrc: Oral Oral Oral Oral  Resp: 18 20 20 18   Height:      Weight:      SpO2:  100% 99% 100%    Physical Exam:  General: alert and cooperative Ext: WNL, no edema. No evidence of DVT seen on physical exam. Breast: Soft filling Lungs: CTAB Heart Arrhythmia heard   Abdomen:  Soft, fundus firm, lochia scant, + bowel sounds, non distended, non tender Lochia: appropriate Uterine Fundus: firm Laceration: healing well BTL incision covered with  Honeycomb  CDI    Recent Labs  07/04/14 0430 07/05/14 0554  HGB 11.8* 11.3*  HCT 34.6* 33.2*    Assessment S/P Vaginal Delivery-Day 2 Stable  Normal Involution Breastfeeding    Plan: Continue current care Cardiology consult Discharge to home today pending cardiology clearance  Lactation support    [name], CNM, MSN 07/06/2014, 9:48 AM

## 2014-07-06 NOTE — Progress Notes (Signed)
Heartrate continues to be irregular, 4 beats within a minute. Shared information with [facility] , Nurse midwife at [phone]

## 2014-07-07 ENCOUNTER — Inpatient Hospital Stay (HOSPITAL_COMMUNITY): Admission: RE | Admit: 2014-07-07 | Payer: Medicaid Other | Source: Ambulatory Visit

## 2014-10-02 ENCOUNTER — Encounter (HOSPITAL_COMMUNITY): Payer: Self-pay | Admitting: Emergency Medicine

## 2014-10-02 ENCOUNTER — Emergency Department (HOSPITAL_COMMUNITY): Payer: Self-pay

## 2014-10-02 ENCOUNTER — Emergency Department (HOSPITAL_COMMUNITY): Payer: Medicaid Other

## 2014-10-02 ENCOUNTER — Emergency Department (HOSPITAL_COMMUNITY)
Admission: EM | Admit: 2014-10-02 | Discharge: 2014-10-02 | Disposition: A | Discharge status: Home / Self Care | Payer: Self-pay | Attending: Emergency Medicine | Admitting: Emergency Medicine

## 2014-10-02 DIAGNOSIS — R079 Chest pain, unspecified: Secondary | ICD-10-CM | POA: Insufficient documentation

## 2014-10-02 DIAGNOSIS — Z79899 Other long term (current) drug therapy: Secondary | ICD-10-CM | POA: Insufficient documentation

## 2014-10-02 DIAGNOSIS — Z86711 Personal history of pulmonary embolism: Secondary | ICD-10-CM | POA: Insufficient documentation

## 2014-10-02 DIAGNOSIS — M549 Dorsalgia, unspecified: Secondary | ICD-10-CM | POA: Insufficient documentation

## 2014-10-02 DIAGNOSIS — Z880 Allergy status to penicillin: Secondary | ICD-10-CM | POA: Insufficient documentation

## 2014-10-02 DIAGNOSIS — J45909 Unspecified asthma, uncomplicated: Secondary | ICD-10-CM | POA: Insufficient documentation

## 2014-10-02 DIAGNOSIS — Z3202 Encounter for pregnancy test, result negative: Secondary | ICD-10-CM | POA: Insufficient documentation

## 2014-10-02 DIAGNOSIS — R0602 Shortness of breath: Secondary | ICD-10-CM | POA: Insufficient documentation

## 2014-10-02 DIAGNOSIS — Z7901 Long term (current) use of anticoagulants: Secondary | ICD-10-CM | POA: Insufficient documentation

## 2014-10-02 DIAGNOSIS — D649 Anemia, unspecified: Secondary | ICD-10-CM | POA: Insufficient documentation

## 2014-10-02 DIAGNOSIS — R05 Cough: Secondary | ICD-10-CM | POA: Insufficient documentation

## 2014-10-02 LAB — BASIC METABOLIC PANEL
ANION GAP: 5 (ref 5–15)
BUN: 12 mg/dL (ref 6–20)
CHLORIDE: 107 mmol/L (ref 101–111)
CO2: 27 mmol/L (ref 22–32)
CREATININE: 0.64 mg/dL (ref 0.44–1.00)
Calcium: 8.8 mg/dL — ABNORMAL LOW (ref 8.9–10.3)
GFR calc Af Amer: >60 mL/min (ref >60)
GFR calc non Af Amer: >60 mL/min (ref >60)
Glucose, Bld: 107 mg/dL — ABNORMAL HIGH (ref 65–99)
Potassium: 3.7 mmol/L (ref 3.5–5.1)
SODIUM: 139 mmol/L (ref 135–145)

## 2014-10-02 LAB — CBC
HCT: 40.1 % (ref 36.0–46.0)
HEMOGLOBIN: 12.9 g/dL (ref 12.0–15.0)
MCH: 29.7 pg (ref 26.0–34.0)
MCHC: 32.2 g/dL (ref 30.0–36.0)
MCV: 92.4 fL (ref 78.0–100.0)
PLATELETS: 256 10*3/uL (ref 150–400)
RBC: 4.34 MIL/uL (ref 3.87–5.11)
RDW: 13.9 % (ref 11.5–15.5)
WBC: 6.3 10*3/uL (ref 4.0–10.5)

## 2014-10-02 LAB — I-STAT TROPONIN, ED: TROPONIN I, POC: 0 ng/mL (ref 0.00–0.08)

## 2014-10-02 LAB — POC URINE PREG, ED: Preg Test, Ur: NEGATIVE

## 2014-10-02 MED ORDER — SODIUM CHLORIDE 0.9 % IV BOLUS (SEPSIS)
1000.0000 mL | Freq: Once | INTRAVENOUS | Status: AC
  Administered 2014-10-02: 1000 mL via INTRAVENOUS

## 2014-10-02 MED ORDER — IOHEXOL 350 MG/ML SOLN
100.0000 mL | Freq: Once | INTRAVENOUS | Status: AC | PRN
  Administered 2014-10-02: 100 mL via INTRAVENOUS

## 2014-10-02 NOTE — ED Notes (Signed)
Pt c/o cough x2-3 days. Unable to cough anything up but says, "I can taste something like lead and blood in my mouth." Hx pulmonary embolism and says this feels the same. Denies recent long trips. Gave birth on February 13th of this year. Denies arm or leg swelling. Not currently on blood thinners. No other c/c.

## 2014-10-02 NOTE — Discharge Instructions (Signed)
Chest Pain (Nonspecific) °It is often hard to give a specific diagnosis for the cause of chest pain. There is always a chance that your pain could be related to something serious, such as a heart attack or a blood clot in the lungs. You need to follow up with your health care provider for further evaluation. °CAUSES  °· Heartburn. °· Pneumonia or bronchitis. °· Anxiety or stress. °· Inflammation around your heart (pericarditis) or lung (pleuritis or pleurisy). °· A blood clot in the lung. °· A collapsed lung (pneumothorax). It can develop suddenly on its own (spontaneous pneumothorax) or from trauma to the chest. °· Shingles infection (herpes zoster virus). °The chest wall is composed of bones, muscles, and cartilage. Any of these can be the source of the pain. °· The bones can be bruised by injury. °· The muscles or cartilage can be strained by coughing or overwork. °· The cartilage can be affected by inflammation and become sore (costochondritis). °DIAGNOSIS  °Lab tests or other studies may be needed to find the cause of your pain. Your health care provider may have you take a test called an ambulatory electrocardiogram (ECG). An ECG records your heartbeat patterns over a 24-hour period. You may also have other tests, such as: °· Transthoracic echocardiogram (TTE). During echocardiography, sound waves are used to evaluate how blood flows through your heart. °· Transesophageal echocardiogram (TEE). °· Cardiac monitoring. This allows your health care provider to monitor your heart rate and rhythm in real time. °· Holter monitor. This is a portable device that records your heartbeat and can help diagnose heart arrhythmias. It allows your health care provider to track your heart activity for several days, if needed. °· Stress tests by exercise or by giving medicine that makes the heart beat faster. °TREATMENT  °· Treatment depends on what may be causing your chest pain. Treatment may include: °¨ Acid blockers for  heartburn. °¨ Anti-inflammatory medicine. °¨ Pain medicine for inflammatory conditions. °¨ Antibiotics if an infection is present. °· You may be advised to change lifestyle habits. This includes stopping smoking and avoiding alcohol, caffeine, and chocolate. °· You may be advised to keep your head raised (elevated) when sleeping. This reduces the chance of acid going backward from your stomach into your esophagus. °Most of the time, nonspecific chest pain will improve within 2-3 days with rest and mild pain medicine.  °HOME CARE INSTRUCTIONS  °· If antibiotics were prescribed, take them as directed. Finish them even if you start to feel better. °· For the next few days, avoid physical activities that bring on chest pain. Continue physical activities as directed. °· Do not use any tobacco products, including cigarettes, chewing tobacco, or electronic cigarettes. °· Avoid drinking alcohol. °· Only take medicine as directed by your health care provider. °· Follow your health care provider's suggestions for further testing if your chest pain does not go away. °· Keep any follow-up appointments you made. If you do not go to an appointment, you could develop lasting (chronic) problems with pain. If there is any problem keeping an appointment, call to reschedule. °SEEK MEDICAL CARE IF:  °· Your chest pain does not go away, even after treatment. °· You have a rash with blisters on your chest. °· You have a fever. °SEEK IMMEDIATE MEDICAL CARE IF:  °· You have increased chest pain or pain that spreads to your arm, neck, jaw, back, or abdomen. °· You have shortness of breath. °· You have an increasing cough, or you cough   up blood. °· You have severe back or abdominal pain. °· You feel nauseous or vomit. °· You have severe weakness. °· You faint. °· You have chills. °This is an emergency. Do not wait to see if the pain will go away. Get medical help at once. Call your local emergency services (911 in [place] Do not drive  yourself to the hospital. °MAKE SURE YOU:  °· Understand these instructions. °· Will watch your condition. °· Will get help right away if you are not doing well or get worse. °Document Released: 02/15/2005 Document Revised: 05/13/2013 Document Reviewed: 12/12/2007 °ExitCare® Patient Information ©2015 [employer]. This information is not intended to replace advice given to you by your health care provider. Make sure you discuss any questions you have with your health care provider. ° ° °Emergency Department Resource Guide °1) Find a Doctor and Pay Out of Pocket °Although you won't have to find out who is covered by your insurance plan, it is a good idea to ask around and get recommendations. You will then need to call the office and see if the doctor you have chosen will accept you as a new patient and what types of options they offer for patients who are self-pay. Some doctors offer discounts or will set up payment plans for their patients who do not have insurance, but you will need to ask so you aren't surprised when you get to your appointment. ° °2) Contact Your Local Health Department °Not all health departments have doctors that can see patients for sick visits, but many do, so it is worth a call to see if yours does. If you don't know where your local health department is, you can check in your phone book. The CDC also has a tool to help you locate your state's health department, and many state websites also have listings of all of their local health departments. ° °3) Find a Walk-in Clinic °If your illness is not likely to be very severe or complicated, you may want to try a walk in clinic. These are popping up all over the country in pharmacies, drugstores, and shopping centers. They're usually staffed by nurse practitioners or physician assistants that have been trained to treat common illnesses and complaints. They're usually fairly quick and inexpensive. However, if you have serious medical issues or  chronic medical problems, these are probably not your best option. ° °No Primary Care Doctor: °- Call [facility] at  832-8000 - they can help you locate a primary care doctor that  accepts your insurance, provides certain services, etc. °- Physician Referral Service- 1-800-533-3463 ° °Chronic Pain Problems: °Organization         Address  Phone   Notes  °[facility]  (336) 297-2271 Patients need to be referred by their primary care doctor.  ° °Medication Assistance: °Organization         Address  Phone   Notes  °[place] [facility] [address] °[place], [place] [address] (336) 641-8030 --Must be a resident of [place] °-- Must have NO insurance coverage whatsoever (no Medicaid/ Medicare, etc.) °-- The pt. MUST have a primary care doctor that directs their care regularly and follows them in the community °  °[facility]  (866) 331-1348   °[employer]  (888) 892-1162   ° °Agencies that provide inexpensive medical care: °Organization         Address  Phone   Notes  °[facility]  (  336) 832-8035   °[facility]    (336) 832-7272   °[facility] [address] °[place], [place] [address] (336) 832-4777   °[facility] [address], °[place] (336) 271-4999   °[facility]    (336) 373-0678   °[facility]    (336) 272-1050   °[facility] ° [address], [place] Phone:  (336) 832-4444, Fax:  (336) 832-4440 Hours of Operation:  9 am - 6 pm, M-F.  Also accepts Medicaid/Medicare and self-pay.  °[facility] ° [address], [place] Phone: (336) [phone], Fax: (336) [phone]. Hours of Operation:  8:30 am - 5:30 pm, M-F.  Also accepts Medicaid and self-pay.  °[facility] [place] [address], [place] Phone: (336) 878-6027   °[facility] [address], [place], [place]  (336)723-1848, Ext. 123 Mondays & Thursdays: 7-9 AM.  First 15 patients are seen on a first come, first serve basis. °  ° °Medicaid-accepting [place] Providers: ° °Organization         Address  Phone   Notes  °[facility] [address], [place] (336) 641-2100 Also accepts self-pay patients.  °[facility] [address], [place] ° (336) 856-9996   °[facility] [address], [place] (336) 288-8857   °[facility] [address] [place] [address], [place] (336) 299-7000   °Veita Bland [address], [place]  ° (336) 373-1557 Only accepts [place] Access Medicaid patients after they have their name applied to their card.  ° °Self-Pay (no insurance) in [place]: ° °Organization         Address  Phone   Notes  °Sickle Cell Patients, [facility] [address], [place] (336) 832-1970   °[facility] [address], [place] (336) 832-4400   °[facility] ° [address], [place] (336) 992-4800   °[facility]/Dr. Osei-Bonsu ° [address] [place] [address], [place] or [address], [place] (336) [phone] Phone number for both [place] and [place] locations is the same.  °[facility] [address], [place] (336) 299-0000   °[facility] [address] [place] [address], [place] or [address] (336) 852-7530 °(336) 878-2260   °[facility] [address], [place] (336) 350-1642, phone; (336) 294-5005, fax Sees patients 1st and 3rd Saturday of every month.  Must not qualify for public or private insurance (i.e. Medicaid, Medicare, [place] Health Choice, Veterans' Benefits) • Household income should be no more than 200% of the poverty level •The clinic cannot treat you if you are pregnant or think you are pregnant • Sexually transmitted  diseases are not treated at the clinic.  ° ° °Dental Care: °Organization         Address  Phone  Notes  °[facility] [address],  (336) 641-6152 Accepts children up to age 21 who are enrolled in Medicaid or [place] Health Choice; pregnant women with a Medicaid card; and children who have applied for Medicaid or  Health Choice, but were declined, whose parents can pay a reduced fee at time of service.  °[facility] [place]    [address], [place] (336) 641-7733 Accepts children up to age 21 who are enrolled in Medicaid or [place] Health Choice; pregnant women with a Medicaid card; and children who have applied for Medicaid or [place] Health Choice, but were declined, whose parents can pay a reduced fee at time of service.  °[facility] ° [address], [place] (336) 641-4533 Patients are seen by appointment only. Walk-ins are not accepted. [facility] will see patients 18 years of age and older. °Monday - Tuesday (8am-5pm) °Most Wednesdays (8:30-5pm) °$30 per visit, cash only  °[facility] ° [address], [place] (336) 641-4533 Patients are seen by appointment only. Walk-ins are not accepted. [facility] will see patients 18 years of age and older. °One Wednesday Evening (Monthly: Volunteer Based).  $30 per visit, cash only  °[facility]  (919) [phone] for adults; Children under age 4, call Graduate Pediatric Dentistry at ([phone]) [phone]. Children aged 4-14, please call ([phone]) [phone] to request a pediatric application. ° Dental services are provided in all areas of dental care including fillings, crowns and bridges, complete and partial dentures, implants, gum treatment, root canals, and extractions. Preventive care is also provided. Treatment is provided to both adults and children. °Patients are selected via a  lottery and there is often a waiting list. °  °[facility] [address], °[place] ° (336) 763-8833 [email or web address] °  °[facility] [address], [place], [place] (336)723-1848, Ext. 123 Second and Fourth Thursday of each month, opens at 6:30 AM; Clinic ends at 9 AM.  Patients are seen on a first-come first-served basis, and a limited number are seen during each clinic.  ° °[facility] ° [address], [place], [place] (336) 723-7904   Eligibility Requirements °You must have lived in [place], [place], or [place] counties for at least the last three months. °  You cannot be eligible for state or federal sponsored healthcare insurance, including Veterans Administration, Medicaid, or Medicare. °  You generally cannot be eligible for healthcare insurance through your employer.  °  How to apply: °Eligibility screenings are held every Tuesday and Wednesday afternoon from 1:00 pm until 4:00 pm. You do not need an appointment for the interview!  °[facility] [address], [place], [place] 336-631-2330   °[facility]  336-342-8273   °[facility]  336-703-3100   °[facility]  336-570-6415   ° °Behavioral Health Resources in the Community: °Intensive Outpatient Programs °Organization         Address  Phone  Notes  °[facility] [address], [place], [place] 336-878-6098   °[facility] [address], [place], [place] 336-832-9800   °[facility] [address], [place], [place] ° 336-882-2125   °[facility] [address],  °[place], [place] 1-800-853-5163 or 336-641-4981   °Substance Abuse Resources °Organization         Address  Phone  Notes  °[facility]  336-882-2125   °[facility]  336-784-9470   °[facility]  336-285-9073   °[facility]  336-845-3988   °Residential &  Outpatient Substance Abuse Program  1-800-659-3381   °Psychological Services °Organization         Address  Phone  Notes  °[facility]  336- 832-9600   °  [facility]  336- 378-7881   °[facility] [address], [place] 1-800-853-5163 or 336-641-4981   ° °Mobile Crisis Teams °Organization         Address  Phone  Notes  °[facility], Mobile Crisis Care Unit  1-877-626-1772   °Assertive °[facility] ° [address] [place], [place] 336-834-9664   °Sharon DeEsch [address] °[place] 336-554-5454   ° °Self-Help/Support Groups °Organization         Address  Phone             Notes  °[facility] - variety of support groups  336- 373-1402 Call for more information  °Narcotics Anonymous (NA), [facility] [address], °[place]  2 meetings at this location  ° °Residential Treatment Programs °Organization         Address  Phone  Notes  °[facility] [address],    °[place]  1-866-801-8205   °[facility] ° [address], [place], [place] 704-293-8524   °[facility] [address], [place] 336-845-3988 Admissions: 8am-3pm M-F  °[facility] [address]    °[place], [place] 336-841-1104   °[facility] [address], [place], [place] 336-379-7146   °[facility] [address]  °[place], [place] 336-285-9073   °[facility] - Intensive Outpatient [address], [place], [place] 336-852-3033   °[facility] [address]  °[place], [place] 1-877-615-2722 or 336-784-9470   °[facility] [address] [place], [place] 336-227-7417 Accepts Medicaid  °[facility] [address]  ° [place] 1-800-659-3381 Substance Abuse/Addiction Treatment  ° °[place] Behavioral Health Resources °Organization          Address  Phone  Notes  °[facility]  (888) 581-9988   °Julie Brannon, PhD [address] [place], [place]   (336) 349-5553 or (336) 951-0000   °[facility]   [address] °[place], [place] (336) 349-4454   °[facility] [address], [place], [place] (336) 342-8316 Insurance/Medicaid/sponsorship through [facility]  °[facility] [address]                                    [place], [place] (336) 342-8316 Therapy/tele-psych/case  °[facility] [address]  ° [place], [place] (336) 349-2233    °Dr. Arfeen  (336) 349-4544   °[facility] [place]  [employer] [place] [facility] 1) [address], [place] °2) [address], [place] °3)  [address], [place] (336) 349-3220 °(336) 342-7768 ° °(336) 342-8140   °[place] [facility] (336) 342-1394 or (336) 342-3537 (After Hours)    ° ° ° °

## 2014-10-02 NOTE — ED Provider Notes (Signed)
CSN: [id number]     Arrival date & time 10/02/14  1818 History   First MD Initiated Contact with Patient 10/02/14 1856     Chief Complaint  Patient presents with  . Shortness of Breath  . Cough  . Back Pain     (Consider location/radiation/quality/duration/timing/severity/associated sxs/prior Treatment) Patient is a 34 y.o. female presenting with shortness of breath, cough, and back pain. The history is provided by the patient.  Shortness of Breath Severity:  Moderate Onset quality:  Sudden Duration:  3 days Timing:  Constant Progression:  Unchanged Chronicity:  Recurrent Context: URI   Relieved by:  Nothing Worsened by:  Nothing tried Associated symptoms: chest pain and cough   Associated symptoms: no abdominal pain, no fever and no vomiting   Cough Associated symptoms: chest pain   Associated symptoms: no fever and no shortness of breath   Back Pain Associated symptoms: chest pain   Associated symptoms: no abdominal pain and no fever     Past Medical History  Diagnosis Date  . PE (pulmonary embolism)   . Sickle cell trait   . Anemia   . Asthma     Diagnosed this pregnancy; hasn't used inhaler since Fall 2015   Past Surgical History  Procedure Laterality Date  . No past surgeries    . Tubal ligation Bilateral 07/05/2014    Procedure: POST PARTUM TUBAL LIGATION;  Surgeon: Sharon SellerJennifer M Ozan, DO;  Location: [facility];  Service: Gynecology;  Laterality: Bilateral;   Family History  Problem Relation Age of Onset  . Diabetes Maternal Uncle   . Heart disease Maternal Uncle   . Hypertension Maternal Uncle   . Hearing loss Neg Hx    History  Substance Use Topics  . Smoking status: Never Smoker   . Smokeless tobacco: Never Used  . Alcohol Use: No   OB History    Gravida Para Term Preterm AB TAB SAB Ectopic Multiple Living   3 3 3  0 0 0 0 0 0 3     Review of Systems  Constitutional: Negative for fever.  Respiratory: Positive for cough. Negative for shortness of  breath.   Cardiovascular: Positive for chest pain.  Gastrointestinal: Negative for vomiting and abdominal pain.  Musculoskeletal: Positive for back pain.  All other systems reviewed and are negative.     Allergies  Penicillins  Home Medications   Prior to Admission medications   Medication Sig Start Date End Date Taking? Authorizing Provider  albuterol (PROVENTIL HFA;VENTOLIN HFA) 108 (90 BASE) MCG/ACT inhaler Inhale 1 puff into the lungs every 6 (six) hours as needed for wheezing or shortness of breath.    Historical Provider, MD  enoxaparin (LOVENOX) 40 MG/0.4ML injection Inject 40 mg into the skin daily.    Historical Provider, MD  oxyCODONE-acetaminophen (PERCOCET/ROXICET) 5-325 MG per tablet Take 1 tablet by mouth every 4 (four) hours as needed (for pain scale less than 7). 07/06/14   Venus Standard, CNM  Prenatal Vit-Fe Fumarate-FA (PRENATAL MULTIVITAMIN) TABS tablet Take 1 tablet by mouth daily at 12 noon.    Historical Provider, MD   BP 132/74 mmHg  Pulse 81  Temp(Src) 98.5 F (36.9 C) (Oral)  Resp 20  SpO2 100%  LMP 09/29/2014  Breastfeeding? No Physical Exam  Constitutional: She is oriented to person, place, and time. She appears well-developed and well-nourished. No distress.  HENT:  Head: Normocephalic and atraumatic.  Mouth/Throat: Oropharynx is clear and moist.  Eyes: EOM are normal. Pupils are equal, round, and  reactive to light.  Neck: Normal range of motion. Neck supple.  Cardiovascular: Normal rate and regular rhythm.  Exam reveals no friction rub.   No murmur heard. Pulmonary/Chest: Effort normal and breath sounds normal. No respiratory distress. She has no wheezes. She has no rales.  Abdominal: Soft. She exhibits no distension. There is no tenderness. There is no rebound.  Musculoskeletal: Normal range of motion. She exhibits no edema.  Neurological: She is alert and oriented to person, place, and time.  Skin: No rash noted. She is not diaphoretic.   Nursing note and vitals reviewed.   ED Course  Procedures (including critical care time) Labs Review Labs Reviewed  BASIC METABOLIC PANEL - Abnormal; Notable for the following:    Glucose, Bld 107 (*)    Calcium 8.8 (*)    All other components within normal limits  CBC  I-STAT TROPOININ, ED  POC URINE PREG, ED    Imaging Review Dg Chest 2 View  10/02/2014   CLINICAL DATA:  Shortness of breath, cough since yesterday.  EXAM: CHEST  2 VIEW  COMPARISON:  None.  FINDINGS: The heart size and mediastinal contours are within normal limits. There is no focal infiltrate, pulmonary edema, or pleural effusion. The visualized skeletal structures are unremarkable.  IMPRESSION: No active cardiopulmonary disease.   Electronically Signed   By: Sherian Rein M.D.   On: 10/02/2014 19:31   Ct Angio Chest Pe W/cm &/or Wo Cm  10/02/2014   CLINICAL DATA:  Acute onset of shortness of breath and cough. Left scapular pain. Initial encounter.  EXAM: CT ANGIOGRAPHY CHEST WITH CONTRAST  TECHNIQUE: Multidetector CT imaging of the chest was performed using the standard protocol during bolus administration of intravenous contrast. Multiplanar CT image reconstructions and MIPs were obtained to evaluate the vascular anatomy.  CONTRAST:  OMNIPAQUE IOHEXOL 350 MG/ML SOLN  COMPARISON:  Chest radiograph performed earlier today at 7:18 p.m., and CTA of the chest performed 10/02/2010  FINDINGS: There is no evidence of pulmonary embolus.  Mild focal opacity is noted at the right lung base. The appearance is suggestive of atelectasis, and is similar in appearance to 2012. There is no evidence of significant focal consolidation, pleural effusion or pneumothorax. No masses are identified; no abnormal focal contrast enhancement is seen.  Scattered hilar nodes remain borderline normal in size. The mid esophagus is difficult to fully characterize; no definite mass is seen. No pericardial effusion is identified. The great vessels are  grossly unremarkable in appearance. No axillary lymphadenopathy is seen. The visualized portions of the thyroid gland are unremarkable in appearance.  The visualized portions of the liver and spleen are unremarkable. The visualized portions of the pancreas, gallbladder, stomach, adrenal glands and kidneys are within normal limits.  No acute osseous abnormalities are seen.  Review of the MIP images confirms the above findings.  IMPRESSION: 1. No evidence of pulmonary embolus. 2. Mild focal opacity at the right lung base, with appearance suggestive of atelectasis, similar in appearance to 2012. Lungs otherwise clear.   Electronically Signed   By: Roanna Raider M.D.   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 Began a few days ago. Sharp in her left upper chest radiating to her neck. She is a few months postpartum. Her last PEs were in the postpartum period also. No fevers, shortness of breath. She has a mild cough. She's had no runny nose or sore throat. Labs okay and EKG is okay. CT is negative for PE. She likely has bronchitis. Since symptoms only present for 3 days, will refrain from antibiotics this time. Stable for discharge. Of note she refused pain meds    Elwin MochaBlair Jatniel Verastegui, MD 10/02/14 2348

## 2016-01-27 IMAGING — CR DG CHEST 2V
2 series · 2 of 2 positions shown · non-contrast
Comparison: None.

CLINICAL DATA: Shortness of breath, cough since yesterday.

EXAM:
CHEST  2 VIEW

[w chest pa]
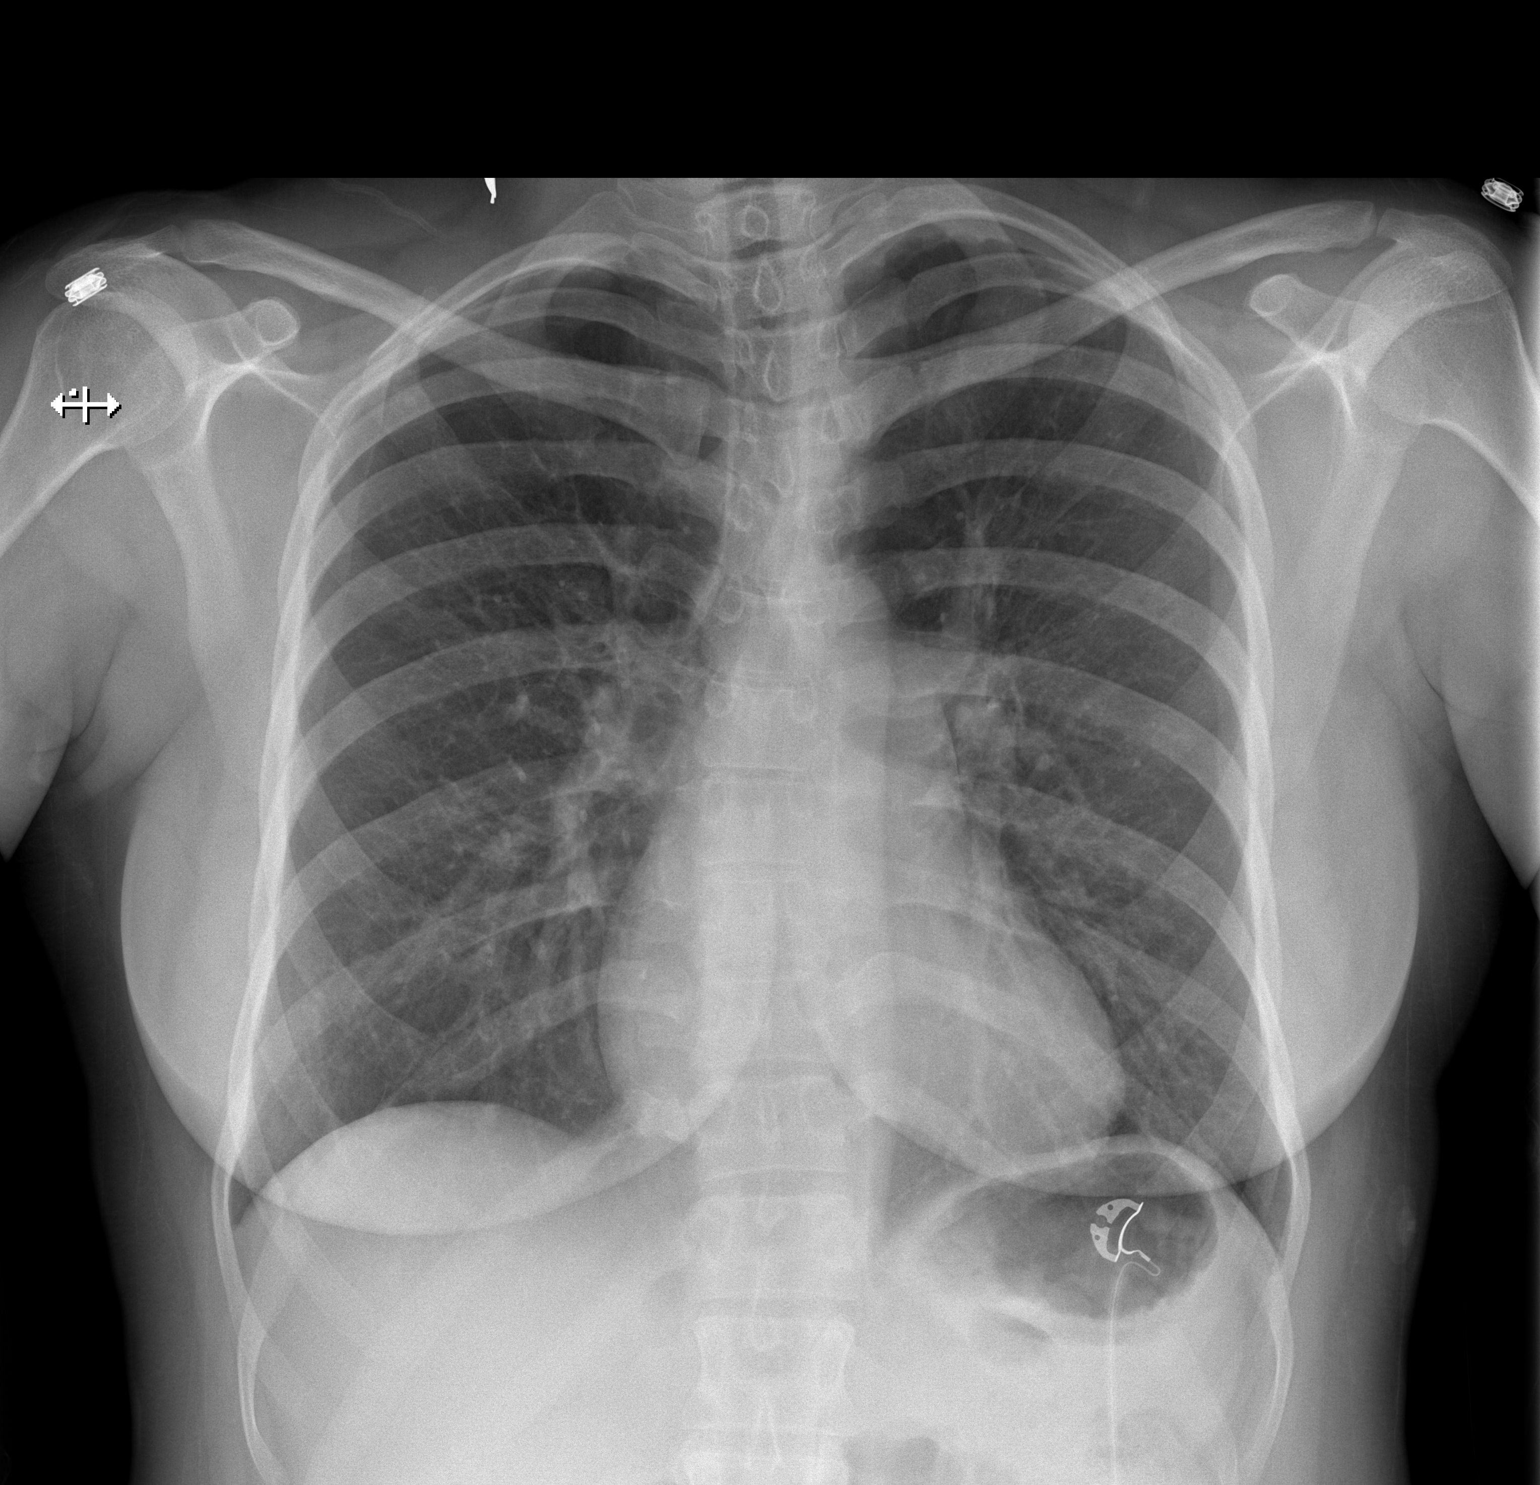

[w chest lat]
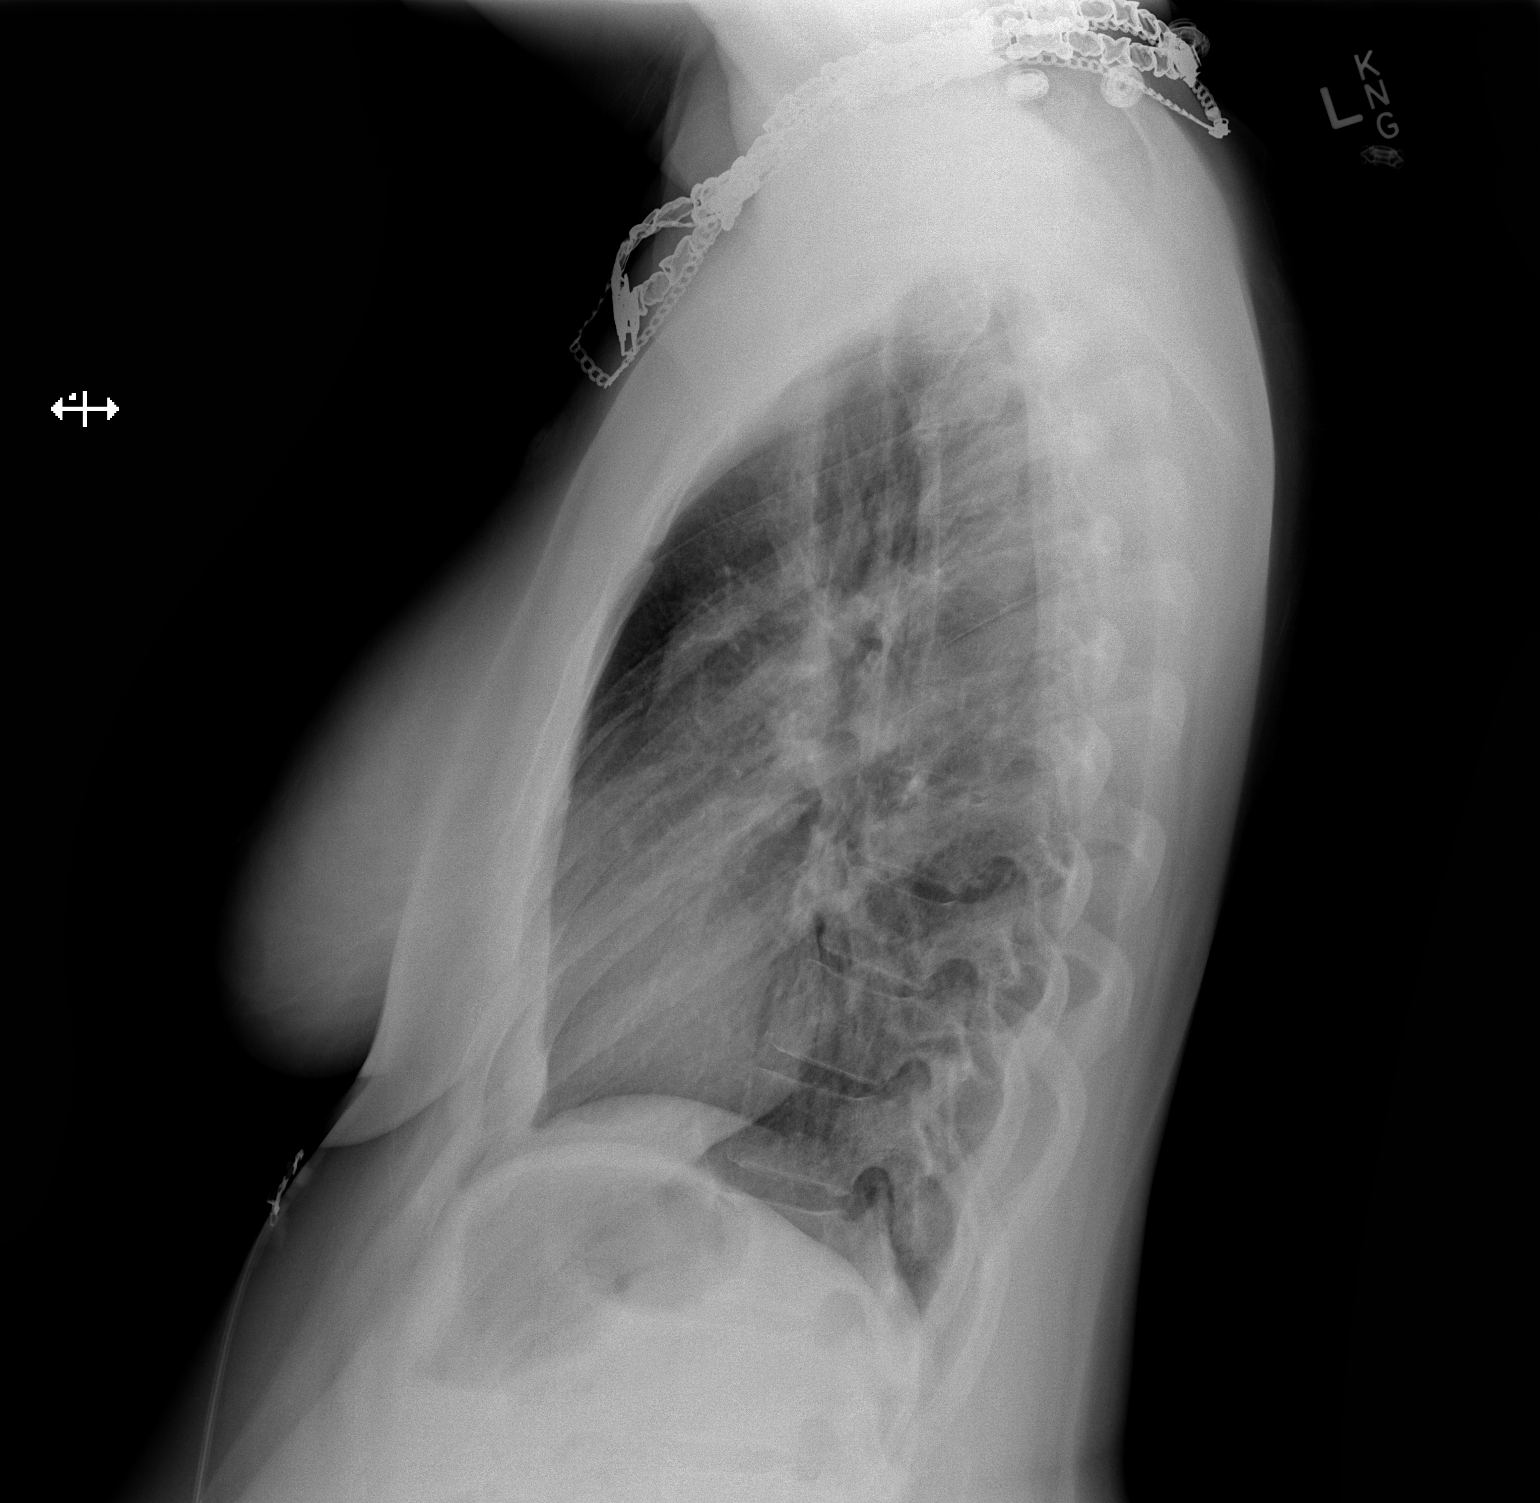

[2 of 2 positions shown; findings below may reference images not displayed]

FINDINGS: The heart size and mediastinal contours are within normal limits.
There is no focal infiltrate, pulmonary edema, or pleural effusion.
The visualized skeletal structures are unremarkable.
IMPRESSION: No active cardiopulmonary disease.

## 2016-06-12 ENCOUNTER — Emergency Department (HOSPITAL_COMMUNITY)
Admission: EM | Admit: 2016-06-12 | Discharge: 2016-06-12 | Disposition: A | Discharge status: Home / Self Care | Payer: Self-pay | Attending: Emergency Medicine | Admitting: Emergency Medicine

## 2016-06-12 ENCOUNTER — Encounter (HOSPITAL_COMMUNITY): Payer: Self-pay

## 2016-06-12 ENCOUNTER — Emergency Department (HOSPITAL_COMMUNITY): Payer: Self-pay

## 2016-06-12 DIAGNOSIS — R079 Chest pain, unspecified: Secondary | ICD-10-CM | POA: Insufficient documentation

## 2016-06-12 DIAGNOSIS — Z5321 Procedure and treatment not carried out due to patient leaving prior to being seen by health care provider: Secondary | ICD-10-CM | POA: Insufficient documentation

## 2016-06-12 DIAGNOSIS — J45909 Unspecified asthma, uncomplicated: Secondary | ICD-10-CM | POA: Insufficient documentation

## 2016-06-12 DIAGNOSIS — Z79899 Other long term (current) drug therapy: Secondary | ICD-10-CM | POA: Insufficient documentation

## 2016-06-12 LAB — BASIC METABOLIC PANEL
ANION GAP: 6 (ref 5–15)
BUN: 11 mg/dL (ref 6–20)
CALCIUM: 8.4 mg/dL — AB (ref 8.9–10.3)
CHLORIDE: 106 mmol/L (ref 101–111)
CO2: 24 mmol/L (ref 22–32)
CREATININE: 0.65 mg/dL (ref 0.44–1.00)
GFR calc non Af Amer: >60 mL/min (ref >60)
Glucose, Bld: 89 mg/dL (ref 65–99)
Potassium: 3.9 mmol/L (ref 3.5–5.1)
Sodium: 136 mmol/L (ref 135–145)

## 2016-06-12 LAB — I-STAT TROPONIN, ED: Troponin i, poc: 0.01 ng/mL (ref 0.00–0.08)

## 2016-06-12 LAB — CBC
HCT: 35.6 % — ABNORMAL LOW (ref 36.0–46.0)
HEMOGLOBIN: 11.7 g/dL — AB (ref 12.0–15.0)
MCH: 29.7 pg (ref 26.0–34.0)
MCHC: 32.9 g/dL (ref 30.0–36.0)
MCV: 90.4 fL (ref 78.0–100.0)
Platelets: 267 10*3/uL (ref 150–400)
RBC: 3.94 MIL/uL (ref 3.87–5.11)
RDW: 13.2 % (ref 11.5–15.5)
WBC: 6.3 10*3/uL (ref 4.0–10.5)

## 2016-06-12 NOTE — ED Notes (Signed)
Called patient in waiting room no answer.  

## 2016-06-12 NOTE — ED Triage Notes (Signed)
Pt reports chest pain near the back of her chest cavity. She reports the pain radiates into her neck. Pt reports pain is worse with a deep breath. 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

## 2016-06-12 NOTE — ED Notes (Signed)
Called pt to take back to the room x3. Pt did not respond.

## 2017-12-28 ENCOUNTER — Other Ambulatory Visit: Payer: Self-pay

## 2017-12-28 ENCOUNTER — Encounter (HOSPITAL_COMMUNITY): Payer: Self-pay | Admitting: Emergency Medicine

## 2017-12-28 ENCOUNTER — Emergency Department (HOSPITAL_COMMUNITY)
Admission: EM | Admit: 2017-12-28 | Discharge: 2017-12-28 | Disposition: A | Discharge status: Home / Self Care | Payer: Managed Care, Other (non HMO) | Attending: Emergency Medicine | Admitting: Emergency Medicine

## 2017-12-28 DIAGNOSIS — L02411 Cutaneous abscess of right axilla: Secondary | ICD-10-CM | POA: Insufficient documentation

## 2017-12-28 DIAGNOSIS — L0291 Cutaneous abscess, unspecified: Secondary | ICD-10-CM

## 2017-12-28 DIAGNOSIS — Z79899 Other long term (current) drug therapy: Secondary | ICD-10-CM | POA: Diagnosis not present

## 2017-12-28 DIAGNOSIS — J45909 Unspecified asthma, uncomplicated: Secondary | ICD-10-CM | POA: Diagnosis not present

## 2017-12-28 DIAGNOSIS — L02412 Cutaneous abscess of left axilla: Secondary | ICD-10-CM | POA: Insufficient documentation

## 2017-12-28 MED ORDER — SULFAMETHOXAZOLE-TRIMETHOPRIM 800-160 MG PO TABS: 1.0000 | ORAL_TABLET | Freq: Two times a day (BID) | ORAL | 0 refills | Status: AC

## 2017-12-28 MED ORDER — LIDOCAINE-EPINEPHRINE (PF) 2 %-1:200000 IJ SOLN
20.0000 mL | Freq: Once | INTRAMUSCULAR | Status: AC
  Administered 2017-12-28: 20 mL via INTRADERMAL
  Filled 2017-12-28: qty 20

## 2017-12-28 MED ORDER — LIDOCAINE-EPINEPHRINE 2 %-1:100000 IJ SOLN: 20.0000 mL | Freq: Once | INTRAMUSCULAR | Status: DC

## 2017-12-28 NOTE — Discharge Instructions (Addendum)
You were given a prescription for antibiotics. Please take the antibiotic prescription fully.   Please return to either urgent care, the emergency department or your family doctor in 48 hours for packing removal.  Return to the ER sooner for any signs of worsening infection.  You were given a referral to general surgery if you continue to have recurrence of your abscesses then you may follow-up with them for reevaluation.

## 2017-12-28 NOTE — ED Triage Notes (Signed)
Pt presents with bilateral underarm abscesses. Patient states that the abscess under her left arm is chronic but the one under her right arm showed up 2 days ago. Patient states she has tried multiple over the counter remedies and has stopped wearing deodorant due to their recurrence.

## 2017-12-28 NOTE — ED Provider Notes (Signed)
[facility]-EMERGENCY DEPT Provider Note   CSN: [id number] Arrival date & time: 12/28/17  2004     History   Chief Complaint Chief Complaint  Patient presents with  . Abscess    HPI Alicia Cardenas is a 37 y.o. female.  HPI   Patient is a 37 year old female who presents the emergency department today for evaluation of bilateral axillary abscesses that have been present for several years.  She states that she has an abscess to the left axilla that is chronic and chronically drains purulent fluid.  She is most concerned today about the abscess to her right axilla that is larger and more painful.  She has had some drainage from this wound as well.  Denies any fevers or chills at home.  Denies any other systemic symptoms.  States she has tried multiple remedies at home including tea tree oil, changing deodorants, not shaving, warm compresses, with no relief.  She has had these incised and drained in the past but not for several years.  Denies any exacerbating or alleviating factors.  Pain is constant and severe in nature.  Has worsened since onset.  Past Medical History:  Diagnosis Date  . Anemia   . Asthma    Diagnosed this pregnancy; hasn't used inhaler since Fall 2015  . PE (pulmonary embolism)   . Sickle cell trait [facility])     Patient Active Problem List   Diagnosis Date Noted  . Vaginal delivery 07/05/2014  . Vaginal bleeding during pregnancy, antepartum 03/30/2014  . Asthma, chronic 03/30/2014  . Penicillin allergy 03/30/2014  . Pain in symphysis pubis during pregnancy 03/30/2014  . History of pulmonary embolus during pregnancy 12/30/2013    Past Surgical History:  Procedure Laterality Date  . NO PAST SURGERIES    . TUBAL LIGATION Bilateral 07/05/2014   Procedure: POST PARTUM TUBAL LIGATION;  Surgeon: Sharon Seller, DO;  Location: [facility];  Service: Gynecology;  Laterality: Bilateral;     OB History    Gravida  3   Para  3   Term  3   Preterm    0   AB  0   Living  3     SAB  0   TAB  0   Ectopic  0   Multiple  0   Live Births  3            Home Medications    Prior to Admission medications   Medication Sig Start Date End Date Taking? Authorizing Provider  albuterol (PROVENTIL HFA;VENTOLIN HFA) 108 (90 BASE) MCG/ACT inhaler Inhale 1 puff into the lungs every 6 (six) hours as needed for wheezing or shortness of breath.    [provider]  enoxaparin (LOVENOX) 40 MG/0.4ML injection Inject 40 mg into the skin daily.    [provider]  oxyCODONE-acetaminophen (PERCOCET/ROXICET) 5-325 MG per tablet Take 1 tablet by mouth every 4 (four) hours as needed (for pain scale less than 7). 07/06/14   Standard, Venus, CNM  Prenatal Vit-Fe Fumarate-FA (PRENATAL MULTIVITAMIN) TABS tablet Take 1 tablet by mouth daily at 12 noon.    [provider]  sulfamethoxazole-trimethoprim (BACTRIM DS,SEPTRA DS) 800-160 MG tablet Take 1 tablet by mouth 2 (two) times daily for 5 days. 12/28/17 01/02/18  Arnetha Silverthorne S, PA-C    Family History Family History  Problem Relation Age of Onset  . Diabetes Maternal Uncle   . Heart disease Maternal Uncle   . Hypertension Maternal Uncle   .  Hearing loss Neg Hx     Social History Social History   Tobacco Use  . Smoking status: Never Smoker  . Smokeless tobacco: Never Used  Substance Use Topics  . Alcohol use: No  . Drug use: No     Allergies   Penicillins   Review of Systems Review of Systems  Constitutional: Negative for chills and fever.  Musculoskeletal:       Pain to bilat axilla  Skin:       abscess     Physical Exam Updated Vital Signs BP 136/87 (BP Location: Left Arm)   Pulse 80   Temp 98.7 F (37.1 C) (Oral)   Resp 18   LMP 12/03/2017   SpO2 99%   Physical Exam  Constitutional: She appears well-developed and well-nourished. No distress.  Nontoxic.  HENT:  Head: Normocephalic and atraumatic.  Eyes: Conjunctivae are normal.  Neck:  Neck supple.  Cardiovascular: Normal rate.  Pulmonary/Chest: Effort normal.  Musculoskeletal: Normal range of motion.  Neurological: She is alert.  Skin: Skin is warm and dry.  Patient has 1 cm area of fluctuance to the left axilla that is actively draining scant amount of purulent fluid.  No significant surrounding erythema.  There is also a 2 cm x 3 cm fluctuant area to the right axilla that appears to have pus pocket with minimal active draining.  Areas are very tender to palpation.  Psychiatric: She has a normal mood and affect.  Nursing note and vitals reviewed.  ED Treatments / Results  Labs (all labs ordered are listed, but only abnormal results are displayed) Labs Reviewed - No data to display  EKG None  Radiology No results found.  Procedures Procedures (including critical care time)  Medications Ordered in ED Medications  lidocaine-EPINEPHrine (XYLOCAINE W/EPI) 2 %-1:200000 (PF) injection 20 mL (has no administration in time range)     Initial Impression / Assessment and Plan / ED Course  I have reviewed the triage vital signs and the nursing notes.  Pertinent labs & imaging results that were available during my care of the patient were reviewed by me and considered in my medical decision making (see chart for details).  Final Clinical Impressions(s) / ED Diagnoses   Final diagnoses:  Abscess   Patient with skin abscess amenable to incision and drainage.  Abscess was large enough to warrant packing.  Wound check advised and packing removal encouraged after 48 hours.  Encouraged home warm soaks and flushing.  Mild signs of cellulitis is surrounding skin.  Will DC home with prescription for Bactrim.  Patient states there is no chance of pregnancy.  Advised to follow-up sooner if she has any new or worsening signs or symptoms.  She understands plan reasons to return.  All questions answered.  ED Discharge Orders         Ordered    sulfamethoxazole-trimethoprim  (BACTRIM DS,SEPTRA DS) 800-160 MG tablet  2 times daily     12/28/17 2149           Karrie MeresCouture, Rosalva Neary S, PA-C 12/28/17 2150    Mancel BaleWentz, Elliott, MD 12/28/17 2306

## 2018-05-04 ENCOUNTER — Other Ambulatory Visit: Payer: Self-pay

## 2018-05-04 ENCOUNTER — Emergency Department (HOSPITAL_COMMUNITY)
Admission: EM | Admit: 2018-05-04 | Discharge: 2018-05-04 | Disposition: A | Discharge status: Home / Self Care | Payer: Managed Care, Other (non HMO) | Attending: Emergency Medicine | Admitting: Emergency Medicine

## 2018-05-04 ENCOUNTER — Emergency Department (HOSPITAL_COMMUNITY): Payer: Managed Care, Other (non HMO)

## 2018-05-04 ENCOUNTER — Encounter (HOSPITAL_COMMUNITY): Payer: Self-pay

## 2018-05-04 DIAGNOSIS — Z86711 Personal history of pulmonary embolism: Secondary | ICD-10-CM | POA: Insufficient documentation

## 2018-05-04 DIAGNOSIS — L732 Hidradenitis suppurativa: Secondary | ICD-10-CM | POA: Insufficient documentation

## 2018-05-04 DIAGNOSIS — R0789 Other chest pain: Secondary | ICD-10-CM

## 2018-05-04 DIAGNOSIS — R0602 Shortness of breath: Secondary | ICD-10-CM | POA: Diagnosis present

## 2018-05-04 DIAGNOSIS — J45909 Unspecified asthma, uncomplicated: Secondary | ICD-10-CM | POA: Insufficient documentation

## 2018-05-04 LAB — CBC
HCT: 39.9 % (ref 36.0–46.0)
Hemoglobin: 12.8 g/dL (ref 12.0–15.0)
MCH: 29.7 pg (ref 26.0–34.0)
MCHC: 32.1 g/dL (ref 30.0–36.0)
MCV: 92.6 fL (ref 80.0–100.0)
Platelets: 268 10*3/uL (ref 150–400)
RBC: 4.31 MIL/uL (ref 3.87–5.11)
RDW: 13.1 % (ref 11.5–15.5)
WBC: 6.7 10*3/uL (ref 4.0–10.5)
nRBC: 0 % (ref 0.0–0.2)

## 2018-05-04 LAB — BASIC METABOLIC PANEL
ANION GAP: 10 (ref 5–15)
BUN: 14 mg/dL (ref 6–20)
CO2: 20 mmol/L — ABNORMAL LOW (ref 22–32)
Calcium: 8.8 mg/dL — ABNORMAL LOW (ref 8.9–10.3)
Chloride: 109 mmol/L (ref 98–111)
Creatinine, Ser: 0.67 mg/dL (ref 0.44–1.00)
GFR calc Af Amer: >60 mL/min (ref >60)
GFR calc non Af Amer: >60 mL/min (ref >60)
GLUCOSE: 89 mg/dL (ref 70–99)
Potassium: 3.7 mmol/L (ref 3.5–5.1)
Sodium: 139 mmol/L (ref 135–145)

## 2018-05-04 LAB — I-STAT BETA HCG BLOOD, ED (MC, WL, AP ONLY): I-stat hCG, quantitative: <5 m[IU]/mL (ref <5)

## 2018-05-04 LAB — I-STAT TROPONIN, ED: Troponin i, poc: 0 ng/mL (ref 0.00–0.08)

## 2018-05-04 MED ORDER — SODIUM CHLORIDE (PF) 0.9 % IJ SOLN
INTRAMUSCULAR | Status: AC
  Filled 2018-05-04: qty 50

## 2018-05-04 MED ORDER — IOPAMIDOL (ISOVUE-370) INJECTION 76%
INTRAVENOUS | Status: AC
  Filled 2018-05-04: qty 100

## 2018-05-04 MED ORDER — IOPAMIDOL (ISOVUE-370) INJECTION 76%
100.0000 mL | Freq: Once | INTRAVENOUS | Status: AC | PRN
  Administered 2018-05-04: 100 mL via INTRAVENOUS

## 2018-05-04 MED ORDER — KETOROLAC TROMETHAMINE 15 MG/ML IJ SOLN
15.0000 mg | Freq: Once | INTRAMUSCULAR | Status: AC
  Administered 2018-05-04: 15 mg via INTRAVENOUS
  Filled 2018-05-04: qty 1

## 2018-05-04 NOTE — ED Triage Notes (Signed)
Patient c/o right chest pain, left arm pain, and SOB x 5 days. Patient reports a history of PE. patient states pain increases when she lies flat.

## 2018-05-04 NOTE — ED Provider Notes (Signed)
[name]-EMERGENCY DEPT Provider Note   CSN: [id number] Arrival date & time: 05/04/18  1615     History   Chief Complaint Chief Complaint  Patient presents with  . Shortness of Breath  . Cough  . Chest Pain    HPI Alicia Cardenas is a 37 y.o. female with past medical history of PE in 2009 after pregnancy and hydradenitis suppuritiva presenting with SOB, cough, and central/right chest pain for the past five days that she states is similar to her previous PE. She states she has non-productive cough, dyspnea, pain laying on her back or right side. She describes her chest pain as constant and without radiation. She additionally has numbness along her inner right arm with swelling in the upper arm that "feels lumpy". She is not on blood thinners currently but was last on them when she was pregnant in 2016. She states she has had recent travel of 2.5 hours to pick up her sister and return. She has a tubal ligation and is not sexually active.   HPI  Past Medical History:  Diagnosis Date  . Anemia   . Asthma    Diagnosed this pregnancy; hasn't used inhaler since Fall 2015  . PE (pulmonary embolism)   . Sickle cell trait [facility])     Patient Active Problem List   Diagnosis Date Noted  . Vaginal delivery 07/05/2014  . Vaginal bleeding during pregnancy, antepartum 03/30/2014  . Asthma, chronic 03/30/2014  . Penicillin allergy 03/30/2014  . Pain in symphysis pubis during pregnancy 03/30/2014  . History of pulmonary embolus during pregnancy 12/30/2013    Past Surgical History:  Procedure Laterality Date  . NO PAST SURGERIES    . TUBAL LIGATION Bilateral 07/05/2014   Procedure: POST PARTUM TUBAL LIGATION;  Surgeon: Sharon Seller, DO;  Location: [facility];  Service: Gynecology;  Laterality: Bilateral;     OB History    Gravida  3   Para  3   Term  3   Preterm  0   AB  0   Living  3     SAB  0   TAB  0   Ectopic  0   Multiple  0   Live Births    3            Home Medications    Prior to Admission medications   Medication Sig Start Date End Date Taking? Authorizing Provider  albuterol (PROVENTIL HFA;VENTOLIN HFA) 108 (90 BASE) MCG/ACT inhaler Inhale 1 puff into the lungs every 6 (six) hours as needed for wheezing or shortness of breath.   Yes [provider]  oxyCODONE-acetaminophen (PERCOCET/ROXICET) 5-325 MG per tablet Take 1 tablet by mouth every 4 (four) hours as needed (for pain scale less than 7). Patient not taking: Reported on 05/04/2018 07/06/14   Standard, Venus, CNM    Family History Family History  Problem Relation Age of Onset  . Diabetes Maternal Uncle   . Heart disease Maternal Uncle   . Hypertension Maternal Uncle   . Hearing loss Neg Hx     Social History Social History   Tobacco Use  . Smoking status: Never Smoker  . Smokeless tobacco: Never Used  Substance Use Topics  . Alcohol use: No  . Drug use: No     Allergies   Penicillins   Review of Systems Review of Systems Review of systems reviewed and are negative for acute change, except as noted in the HPI.    Physical  Exam Updated Vital Signs BP 112/71 (BP Location: Left Arm)   Pulse 70   Temp 98.1 F (36.7 C) (Oral)   Resp 14   Ht 5' 6.5" (1.689 m)   Wt 79.8 kg   LMP 04/06/2018   SpO2 99%   BMI 27.98 kg/m   Physical Exam Constitutional:      General: She is not in acute distress.    Appearance: She is well-developed and normal weight. She is not ill-appearing or diaphoretic.  Cardiovascular:     Rate and Rhythm: Normal rate and regular rhythm.  Pulmonary:     Effort: Pulmonary effort is normal. No tachypnea or respiratory distress.     Breath sounds: Examination of the left-lower field reveals decreased breath sounds. Decreased breath sounds present. No wheezing, rhonchi or rales.  Chest:     Chest wall: Tenderness present. No mass, deformity, crepitus or edema.     Breasts: Breasts are symmetrical.         Right: Tenderness present. No swelling, inverted nipple or mass.        Left: Normal. No swelling, inverted nipple, mass or tenderness.     Comments:   Abdominal:     General: There is no distension.     Palpations: Abdomen is soft.     Tenderness: There is no abdominal tenderness.  Musculoskeletal: Normal range of motion.  Lymphadenopathy:     Upper Body:     Right upper body: No axillary adenopathy.     Left upper body: No axillary adenopathy.  Skin:    General: Skin is warm and dry.     Comments: Right upper arm mass 3x3 cm soft and mobile,  Bilateral hydradenitis R>L without erythema or warmth or purulent drainage.     Neurological:     Mental Status: She is alert and oriented to person, place, and time.     Cranial Nerves: Cranial nerves are intact.     Motor: Motor function is intact.     Comments: decrease sensation flexor surface right arm, no other sensory deficit      ED Treatments / Results  Labs (all labs ordered are listed, but only abnormal results are displayed) Labs Reviewed  BASIC METABOLIC PANEL - Abnormal; Notable for the following components:      Result Value   CO2 20 (*)    Calcium 8.8 (*)    All other components within normal limits  CBC  I-STAT TROPONIN, ED  I-STAT BETA HCG BLOOD, ED ([facility], [facility], [facility] ONLY)    EKG EKG Interpretation  Date/Time:  Saturday May 04 2018 16:25:04 EST Ventricular Rate:  79 PR Interval:    QRS Duration: 87 QT Interval:  367 QTC Calculation: 421 R Axis:   72 Text Interpretation:  Sinus rhythm Confirmed by Tilden Fossa [phone]) on 05/04/2018 5:43:32 PM   Radiology Dg Chest 2 View  Result Date: 05/04/2018 CLINICAL DATA:  Chest pain, shortness of Breath EXAM: CHEST - 2 VIEW COMPARISON:  06/12/2016 FINDINGS: Heart and mediastinal contours are within normal limits. No focal opacities or effusions. No acute bony abnormality. IMPRESSION: No active cardiopulmonary disease. Electronically Signed   By: Charlett Nose  M.D.   On: 05/04/2018 16:54   Ct Angio Chest Pe W And/or Wo Contrast  Result Date: 05/04/2018 CLINICAL DATA:  Right chest pain and shortness of breath. EXAM: CT ANGIOGRAPHY CHEST WITH CONTRAST TECHNIQUE: Multidetector CT imaging of the chest was performed using the standard protocol during bolus administration of intravenous contrast.  Multiplanar CT image reconstructions and MIPs were obtained to evaluate the vascular anatomy. CONTRAST:  100mL ISOVUE-370 IOPAMIDOL (ISOVUE-370) INJECTION 76% COMPARISON:  CTA chest 10/02/2014, 10/02/2010 FINDINGS: Cardiovascular: --Pulmonary arteries: Contrast injection is sufficient to demonstrate satisfactory opacification of the pulmonary arteries to the segmental level. There is no pulmonary embolus. The main pulmonary artery is within normal limits for size. --Aorta: Limited opacification of the aorta due to bolus timing optimization for the pulmonary arteries. Conventional 3 vessel aortic branching pattern. The aortic course and caliber are normal. There is no aortic atherosclerosis. --Heart: Normal size. No pericardial effusion. Mediastinum/Nodes: No mediastinal, hilar or axillary lymphadenopathy. The visualized thyroid and thoracic esophageal course are unremarkable. Lungs/Pleura: No pulmonary nodules or masses. No pleural effusion or pneumothorax. No focal airspace consolidation. No focal pleural abnormality. Upper Abdomen: Contrast bolus timing is not optimized for evaluation of the abdominal organs. Within this limitation, the visualized organs of the upper abdomen are normal. Musculoskeletal: No chest wall abnormality. No acute or significant osseous findings. Review of the MIP images confirms the above findings. IMPRESSION: No pulmonary embolus or other acute thoracic abnormality. Electronically Signed   By: Deatra RobinsonKevin  Herman M.D.   On: 05/04/2018 18:35    Procedures Procedures (including critical care time)  Medications Ordered in ED Medications  iopamidol  (ISOVUE-370) 76 % injection 100 mL (100 mLs Intravenous Contrast Given 05/04/18 1804)  ketorolac (TORADOL) 15 MG/ML injection 15 mg (15 mg Intravenous Given 05/04/18 2053)     Initial Impression / Assessment and Plan / ED Course  I have reviewed the triage vital signs and the nursing notes.  Pertinent labs & imaging results that were available during my care of the patient were reviewed by me and considered in my medical decision making (see chart for details).     37yo female with PMH of PE in 2009 after pregnancy presenting with SOB, chest and back pain that she states is similar to previous PE. She has had tubal ligation and states she is not sexually active. Labs unremarkable. Betahcg negative. She was not tachycardic, EKG, chest xray and trop unremarkable. High risk for PE and CTA ordered. CTA of chest unremarkable and did not show PE.  She additionally endorsed right arm numbness in the flexor area. No mass or lymphadenopathy, but minor focal swelling in the inferior aspect of upper right arm significant for possible lipoma. This was non-tender. No signs of upper extremity DVT. She did have signs of hydradenitis suppurativa but without purulent drainage, tenderness, erythema, swelling or warmth.   On further discussion chest wall pain extends more to the right breast and chest wall. Minor chest wall tenderness right medial chest wall, breast exam with no masses or lumps or tenderness. Exact etiology of her symptoms unclear, but she has no signs of infection, DVT, PE or breast mass. Possible musculoskeletal pain as her pain is positional. She was given toradol and instructed for outpatient followup. Additionally given strict return precautions if she develops increased SOB, fever, arm or breast swelling or pain.   Final Clinical Impressions(s) / ED Diagnoses   Final diagnoses:  Chest wall pain  Hydradenitis    ED Discharge Orders    None       Selby Slovacek A, DO 05/05/18  0100    Tilden Fossaees, Elizabeth, MD 05/08/18 1126

## 2018-05-04 NOTE — Discharge Instructions (Signed)
Thank you for allowing us to be part of your care.   If your develop worsening shortness of breath, swelling in your arm or breast, fever or palpitations please go to Urgent Care or return to the emergency department.

## 2019-02-17 ENCOUNTER — Other Ambulatory Visit: Payer: Self-pay

## 2019-02-17 DIAGNOSIS — Z20822 Contact with and (suspected) exposure to covid-19: Secondary | ICD-10-CM

## 2019-02-18 LAB — NOVEL CORONAVIRUS, NAA: SARS-CoV-2, NAA: NOT DETECTED

## 2019-05-25 ENCOUNTER — Ambulatory Visit
Admission: EM | Admit: 2019-05-25 | Discharge: 2019-05-25 | Disposition: A | Discharge status: Home / Self Care | Payer: Self-pay

## 2019-05-25 ENCOUNTER — Encounter: Payer: Self-pay | Admitting: Emergency Medicine

## 2019-05-25 ENCOUNTER — Other Ambulatory Visit: Payer: Self-pay

## 2019-05-25 DIAGNOSIS — Z20822 Contact with and (suspected) exposure to covid-19: Secondary | ICD-10-CM

## 2019-05-25 NOTE — ED Triage Notes (Signed)
Pt presents to [facility] for assessment after her daughter was exposed to someone COVID positive before christmas.  Daughter has already tested negative, patient wanting testing.

## 2019-05-25 NOTE — Discharge Instructions (Signed)
Testing not indicated at this time as you have been without fever, symptoms, and your secondary exposure tested negative. Please return if you personally have close exposure, fever, cough, shortness of breath is testing may be indicated at that time.

## 2019-05-25 NOTE — ED Provider Notes (Signed)
[facility]    CSN: [id number] Arrival date & time: 05/25/19  1404      History   Chief Complaint Chief Complaint  Patient presents with  . APPT: 200pm  . COVID Exposure    HPI Alicia Cardenas is a 39 y.o. female history of asthma sickle cell trait   Presenting for Covid testing: Exposure: daughter who was exposed to Covid via coworker on 12/23.  Daughter had negative test yesterday and has been without fever, symptoms. Date of exposure: Chronic/cohabitate Any fever, symptoms since exposure: None   Past Medical History:  Diagnosis Date  . Anemia   . Asthma    Diagnosed this pregnancy; hasn't used inhaler since Fall 2015  . PE (pulmonary embolism)   . Sickle cell trait [facility])     Patient Active Problem List   Diagnosis Date Noted  . Vaginal delivery 07/05/2014  . Vaginal bleeding during pregnancy, antepartum 03/30/2014  . Asthma, chronic 03/30/2014  . Penicillin allergy 03/30/2014  . Pain in symphysis pubis during pregnancy 03/30/2014  . History of pulmonary embolus during pregnancy 12/30/2013    Past Surgical History:  Procedure Laterality Date  . NO PAST SURGERIES    . TUBAL LIGATION Bilateral 07/05/2014   Procedure: POST PARTUM TUBAL LIGATION;  Surgeon: Annalee Genta, DO;  Location: [facility];  Service: Gynecology;  Laterality: Bilateral;    OB History    Gravida  3   Para  3   Term  3   Preterm  0   AB  0   Living  3     SAB  0   TAB  0   Ectopic  0   Multiple  0   Live Births  3            Home Medications    Prior to Admission medications   Medication Sig Start Date End Date Taking? Authorizing Provider  albuterol (PROVENTIL HFA;VENTOLIN HFA) 108 (90 BASE) MCG/ACT inhaler Inhale 1 puff into the lungs every 6 (six) hours as needed for wheezing or shortness of breath.   Yes [provider]    Family History Family History  Problem Relation Age of Onset  . Diabetes Maternal Uncle   . Heart disease Maternal  Uncle   . Hypertension Maternal Uncle   . Hearing loss Neg Hx     Social History Social History   Tobacco Use  . Smoking status: Never Smoker  . Smokeless tobacco: Never Used  Substance Use Topics  . Alcohol use: No  . Drug use: No     Allergies   Penicillins   Review of Systems Review of Systems  Constitutional: Negative for fatigue and fever.  HENT: Negative for ear pain, sinus pain, sore throat and voice change.   Eyes: Negative for pain, redness and visual disturbance.  Respiratory: Negative for cough and shortness of breath.   Cardiovascular: Negative for chest pain and palpitations.  Gastrointestinal: Negative for abdominal pain, diarrhea and vomiting.  Musculoskeletal: Negative for arthralgias and myalgias.  Skin: Negative for rash and wound.  Neurological: Negative for syncope and headaches.     Physical Exam Triage Vital Signs ED Triage Vitals  Enc Vitals Group     BP 05/25/19 1425 (!) 110/50     Pulse Rate 05/25/19 1425 91     Resp 05/25/19 1425 16     Temp 05/25/19 1425 98.2 F (36.8 C)     Temp Source 05/25/19 1425 Oral  SpO2 05/25/19 1425 96 %     Weight --      Height --      Head Circumference --      Peak Flow --      Pain Score 05/25/19 1446 0     Pain Loc --      Pain Edu? --      Excl. in GC? --    No data found.  Updated Vital Signs BP 105/65 (BP Location: Left Arm)   Pulse 91   Temp 98.2 F (36.8 C) (Oral)   Resp 16   LMP 05/06/2019   SpO2 96%   Visual Acuity Right Eye Distance:   Left Eye Distance:   Bilateral Distance:    Right Eye Near:   Left Eye Near:    Bilateral Near:     Physical Exam Constitutional:      General: She is not in acute distress. HENT:     Head: Normocephalic and atraumatic.  Eyes:     General: No scleral icterus.    Pupils: Pupils are equal, round, and reactive to light.  Cardiovascular:     Rate and Rhythm: Normal rate.  Pulmonary:     Effort: Pulmonary effort is normal.  Skin:     Coloration: Skin is not jaundiced or pale.  Neurological:     Mental Status: She is alert and oriented to person, place, and time.      UC Treatments / Results  Labs (all labs ordered are listed, but only abnormal results are displayed) Labs Reviewed - No data to display  EKG   Radiology No results found.  Procedures Procedures (including critical care time)  Medications Ordered in UC Medications - No data to display  Initial Impression / Assessment and Plan / UC Course  I have reviewed the triage vital signs and the nursing notes.  Pertinent labs & imaging results that were available during my care of the patient were reviewed by me and considered in my medical decision making (see chart for details).     Patient afebrile, nontoxic, with SpO2 96%.  Discussed low utility of Covid testing at this time given patient's exposure secondary, and her primary source for this Covid exposure tested negative.  We are also 11 days out from time of exposure.  Low likelihood of Covid infection: Covid testing deferred at this time.  Return precautions discussed, patient verbalized understanding and is agreeable to plan. Final Clinical Impressions(s) / UC Diagnoses   Final diagnoses:  Exposure to COVID-19 virus     Discharge Instructions     Testing not indicated at this time as you have been without fever, symptoms, and your secondary exposure tested negative. Please return if you personally have close exposure, fever, cough, shortness of breath is testing may be indicated at that time.    ED Prescriptions    None     PDMP not reviewed this encounter.   Hall[place]Potvin, [place], [place] 05/25/19 1710

## 2020-01-16 ENCOUNTER — Ambulatory Visit
Admission: EM | Admit: 2020-01-16 | Discharge: 2020-01-16 | Disposition: A | Discharge status: Home / Self Care | Payer: Self-pay | Attending: Family Medicine | Admitting: Family Medicine

## 2020-01-16 DIAGNOSIS — H1033 Unspecified acute conjunctivitis, bilateral: Secondary | ICD-10-CM

## 2020-01-16 MED ORDER — CIPROFLOXACIN HCL 0.3 % OP OINT: TOPICAL_OINTMENT | OPHTHALMIC | 0 refills | Status: DC

## 2020-01-16 MED ORDER — POLYMYXIN B-TRIMETHOPRIM 10000-0.1 UNIT/ML-% OP SOLN: 2.0000 [drp] | Freq: Two times a day (BID) | OPHTHALMIC | 0 refills | Status: DC

## 2020-01-16 MED ORDER — POLYMYXIN B-TRIMETHOPRIM 10000-0.1 UNIT/ML-% OP SOLN: 2.0000 [drp] | Freq: Two times a day (BID) | OPHTHALMIC | 0 refills | Status: AC

## 2020-01-16 NOTE — Discharge Instructions (Addendum)
Apply eye ointment to both lower eyelids at bedtime x 7 days. If symptoms worsen or do not improve return for re-evaluation

## 2020-01-16 NOTE — ED Triage Notes (Signed)
Pt present right redness and pressure, pt states it started with left eye first then went to right eye.

## 2020-01-16 NOTE — ED Provider Notes (Signed)
[facility]    CSN: [id number] Arrival date & time: 01/16/20  1215      History   Chief Complaint Chief Complaint  Patient presents with  . Eye Problem    HPI Alicia Cardenas is a 39 y.o. female.   HPI  Bilateral eye pressure, redness, and eye irritation. No sensation of FB. She awaken today with the right eye reddened and burning. Denies visual disturbances. No associated URI symptoms or fever.  Past Medical History:  Diagnosis Date  . Anemia   . Asthma    Diagnosed this pregnancy; hasn't used inhaler since Fall 2015  . PE (pulmonary embolism)   . Sickle cell trait [facility])     Patient Active Problem List   Diagnosis Date Noted  . Vaginal delivery 07/05/2014  . Vaginal bleeding during pregnancy, antepartum 03/30/2014  . Asthma, chronic 03/30/2014  . Penicillin allergy 03/30/2014  . Pain in symphysis pubis during pregnancy 03/30/2014  . History of pulmonary embolus during pregnancy 12/30/2013    Past Surgical History:  Procedure Laterality Date  . NO PAST SURGERIES    . TUBAL LIGATION Bilateral 07/05/2014   Procedure: POST PARTUM TUBAL LIGATION;  Surgeon: Sharon Seller, DO;  Location: [facility];  Service: Gynecology;  Laterality: Bilateral;    OB History    Gravida  3   Para  3   Term  3   Preterm  0   AB  0   Living  3     SAB  0   TAB  0   Ectopic  0   Multiple  0   Live Births  3            Home Medications    Prior to Admission medications   Medication Sig Start Date End Date Taking? Authorizing Provider  albuterol (PROVENTIL HFA;VENTOLIN HFA) 108 (90 BASE) MCG/ACT inhaler Inhale 1 puff into the lungs every 6 (six) hours as needed for wheezing or shortness of breath.    [provider]    Family History Family History  Problem Relation Age of Onset  . Diabetes Maternal Uncle   . Heart disease Maternal Uncle   . Hypertension Maternal Uncle   . Hearing loss Neg Hx     Social History Social History    Tobacco Use  . Smoking status: Never Smoker  . Smokeless tobacco: Never Used  Vaping Use  . Vaping Use: Never used  Substance Use Topics  . Alcohol use: No  . Drug use: No     Allergies   Penicillins   Review of Systems Review of Systems Pertinent negatives listed in HPI  Physical Exam Triage Vital Signs ED Triage Vitals  Enc Vitals Group     BP 01/16/20 1258 118/72     Pulse Rate 01/16/20 1258 84     Resp 01/16/20 1258 16     Temp 01/16/20 1258 98.2 F (36.8 C)     Temp Source 01/16/20 1258 Oral     SpO2 01/16/20 1258 98 %     Weight --      Height --      Head Circumference --      Peak Flow --      Pain Score 01/16/20 1259 4     Pain Loc --      Pain Edu? --      Excl. in GC? --    No data found.  Updated Vital Signs BP 118/72 (BP Location: Right Arm)  Pulse 84   Temp 98.2 F (36.8 C) (Oral)   Resp 16   SpO2 98%   Visual Acuity Right Eye Distance:   Left Eye Distance:   Bilateral Distance:    Right Eye Near:   Left Eye Near:    Bilateral Near:     Physical Exam General appearance: alert, well developed, well nourished, cooperative and in no distress Head: Normocephalic, without obvious abnormality, atraumatic Eyes: PERRLA, sclera (R) erythematous, (R) conjunctivae injected and erythematous clear discharge   Respiratory: Respirations even and unlabored, normal respiratory rate Heart: rate and rhythm normal. No gallop or murmurs noted on exam  Skin: Skin color, texture, turgor normal. No rashes seen  Psych: Appropriate mood and affect.    UC Treatments / Results  Labs (all labs ordered are listed, but only abnormal results are displayed) Labs Reviewed - No data to display  EKG   Radiology No results found.  Procedures Procedures (including critical care time)  Medications Ordered in UC Medications - No data to display  Initial Impression / Assessment and Plan / UC Course  I have reviewed the triage vital signs and the  nursing notes.  Pertinent labs & imaging results that were available during my care of the patient were reviewed by me and considered in my medical decision making (see chart for details).     Treatment per orders. Return if symptoms do not resolve. Red flags discussed that warrant emergent follow-up in ER. An After Visit Summary was printed and given.  Final Clinical Impressions(s) / UC Diagnoses   Final diagnoses:  Acute conjunctivitis of both eyes, unspecified acute conjunctivitis type     Discharge Instructions     Apply eye ointment to both lower eyelids at bedtime x 7 days. If symptoms worsen or do not improve return for re-evaluation     ED Prescriptions    None     PDMP not reviewed this encounter.   Bing Neighbors, FNP 01/23/20 [phone]

## 2020-02-23 ENCOUNTER — Ambulatory Visit
Admission: EM | Admit: 2020-02-23 | Discharge: 2020-02-23 | Disposition: A | Discharge status: Home / Self Care | Payer: 59 | Attending: Emergency Medicine | Admitting: Emergency Medicine

## 2020-02-23 DIAGNOSIS — L03011 Cellulitis of right finger: Secondary | ICD-10-CM | POA: Diagnosis not present

## 2020-02-23 MED ORDER — DOXYCYCLINE HYCLATE 100 MG PO CAPS: 100.0000 mg | ORAL_CAPSULE | Freq: Two times a day (BID) | ORAL | 0 refills | Status: AC

## 2020-02-23 MED ORDER — TETANUS-DIPHTH-ACELL PERTUSSIS 5-2.5-18.5 LF-MCG/0.5 IM SUSP: 0.5000 mL | Freq: Once | INTRAMUSCULAR | Status: DC

## 2020-02-23 MED ORDER — FLUCONAZOLE 150 MG PO TABS: 150.0000 mg | ORAL_TABLET | Freq: Every day | ORAL | 0 refills | Status: DC

## 2020-02-23 NOTE — ED Provider Notes (Signed)
[facility]    CSN: [id number] Arrival date & time: 02/23/20  1324      History   Chief Complaint Chief Complaint  Patient presents with  . Finger Injury    HPI Alicia Cardenas is a 39 y.o. female  Presenting for persistent pain, swelling to distal aspect of right thumb s/p crush injury.  Endorses history of purulent discharge.  Past Medical History:  Diagnosis Date  . Anemia   . Asthma    Diagnosed this pregnancy; hasn't used inhaler since Fall 2015  . PE (pulmonary embolism)   . Sickle cell trait [facility])     Patient Active Problem List   Diagnosis Date Noted  . Vaginal delivery 07/05/2014  . Vaginal bleeding during pregnancy, antepartum 03/30/2014  . Asthma, chronic 03/30/2014  . Penicillin allergy 03/30/2014  . Pain in symphysis pubis during pregnancy 03/30/2014  . History of pulmonary embolus during pregnancy 12/30/2013    Past Surgical History:  Procedure Laterality Date  . NO PAST SURGERIES    . TUBAL LIGATION Bilateral 07/05/2014   Procedure: POST PARTUM TUBAL LIGATION;  Surgeon: Sharon Seller, DO;  Location: [facility];  Service: Gynecology;  Laterality: Bilateral;    OB History    Gravida  3   Para  3   Term  3   Preterm  0   AB  0   Living  3     SAB  0   TAB  0   Ectopic  0   Multiple  0   Live Births  3            Home Medications    Prior to Admission medications   Medication Sig Start Date End Date Taking? Authorizing Provider  albuterol (PROVENTIL HFA;VENTOLIN HFA) 108 (90 BASE) MCG/ACT inhaler Inhale 1 puff into the lungs every 6 (six) hours as needed for wheezing or shortness of breath.   Yes [provider]  doxycycline (VIBRAMYCIN) 100 MG capsule Take 1 capsule (100 mg total) by mouth 2 (two) times daily for 5 days. 02/23/20 02/28/20  Hall-Potvin, [place], PA-C  fluconazole (DIFLUCAN) 150 MG tablet Take 1 tablet (150 mg total) by mouth daily. May repeat in 72 hours if needed 02/23/20   Hall-Potvin,  [place], PA-C    Family History Family History  Problem Relation Age of Onset  . Diabetes Maternal Uncle   . Heart disease Maternal Uncle   . Hypertension Maternal Uncle   . Hearing loss Neg Hx     Social History Social History   Tobacco Use  . Smoking status: Never Smoker  . Smokeless tobacco: Never Used  Vaping Use  . Vaping Use: Never used  Substance Use Topics  . Alcohol use: No  . Drug use: No     Allergies   Penicillins   Review of Systems As per HPI   Physical Exam Triage Vital Signs ED Triage Vitals  Enc Vitals Group     BP 02/23/20 1335 101/72     Pulse Rate 02/23/20 1335 86     Resp 02/23/20 1335 18     Temp 02/23/20 1335 98.3 F (36.8 C)     Temp Source 02/23/20 1335 Oral     SpO2 02/23/20 1335 95 %     Weight --      Height --      Head Circumference --      Peak Flow --      Pain Score 02/23/20 1336 5  Pain Loc --      Pain Edu? --      Excl. in GC? --    No data found.  Updated Vital Signs BP 101/72 (BP Location: Left Arm)   Pulse 86   Temp 98.3 F (36.8 C) (Oral)   Resp 18   LMP 02/22/2020   SpO2 95%   Visual Acuity Right Eye Distance:   Left Eye Distance:   Bilateral Distance:    Right Eye Near:   Left Eye Near:    Bilateral Near:     Physical Exam Constitutional:      General: She is not in acute distress. HENT:     Head: Normocephalic and atraumatic.  Eyes:     General: No scleral icterus.    Pupils: Pupils are equal, round, and reactive to light.  Cardiovascular:     Rate and Rhythm: Normal rate.  Pulmonary:     Effort: Pulmonary effort is normal.  Musculoskeletal:        General: Swelling and tenderness present. Normal range of motion.     Comments: Paronychia noted to distal aspect of right thumb.  No active discharge, open wound.  NVI  Skin:    Coloration: Skin is not jaundiced or pale.  Neurological:     Mental Status: She is alert and oriented to person, place, and time.      UC Treatments /  Results  Labs (all labs ordered are listed, but only abnormal results are displayed) Labs Reviewed - No data to display  EKG   Radiology No results found.  Procedures Procedures (including critical care time)  Medications Ordered in UC Medications - No data to display  Initial Impression / Assessment and Plan / UC Course  I have reviewed the triage vital signs and the nursing notes.  Pertinent labs & imaging results that were available during my care of the patient were reviewed by me and considered in my medical decision making (see chart for details).     Needle I&D performed: Patient tolerated well.  Will follow with course antibiotics.  Return precautions discussed, pt verbalized understanding and is agreeable to plan. Final Clinical Impressions(s) / UC Diagnoses   Final diagnoses:  Paronychia of right thumb     Discharge Instructions     Keep area(s) clean and dry. Apply hot compress / towel for 5-10 minutes 3-5 times daily. Take antibiotic as prescribed with food - important to complete course. Return for worsening pain, redness, swelling, discharge, fever.    ED Prescriptions    Medication Sig Dispense Auth. Provider   doxycycline (VIBRAMYCIN) 100 MG capsule Take 1 capsule (100 mg total) by mouth 2 (two) times daily for 5 days. 10 capsule Hall-Potvin, [place], PA-C   fluconazole (DIFLUCAN) 150 MG tablet Take 1 tablet (150 mg total) by mouth daily. May repeat in 72 hours if needed 2 tablet Hall-Potvin, [place], PA-C     PDMP not reviewed this encounter.   Hall-Potvin, [place], [place] 02/23/20 1356

## 2020-02-23 NOTE — ED Triage Notes (Signed)
Pt reports slamming R thumb in car door about one week ago.  Initially just a throbbing pain but developed what appears to be a pocket of pus on end of thumb yesterday.  Has been applying ice and taking Ibuprofen with some relief.  Poked it with a safety pen this morning and drained some very light yellow pus.

## 2020-02-23 NOTE — ED Triage Notes (Signed)
Pt discharged by provider prior to receiving Boostrix.

## 2020-02-23 NOTE — Discharge Instructions (Addendum)
Keep area(s) clean and dry. Apply hot compress / towel for 5-10 minutes 3-5 times daily. Take antibiotic as prescribed with food - important to complete course. Return for worsening pain, redness, swelling, discharge, fever 

## 2022-01-12 ENCOUNTER — Encounter (HOSPITAL_BASED_OUTPATIENT_CLINIC_OR_DEPARTMENT_OTHER): Payer: Self-pay | Admitting: Nurse Practitioner

## 2022-01-12 ENCOUNTER — Other Ambulatory Visit (HOSPITAL_BASED_OUTPATIENT_CLINIC_OR_DEPARTMENT_OTHER): Payer: Self-pay

## 2022-01-12 ENCOUNTER — Ambulatory Visit (HOSPITAL_BASED_OUTPATIENT_CLINIC_OR_DEPARTMENT_OTHER): Payer: BC Managed Care – PPO | Admitting: Nurse Practitioner

## 2022-01-12 VITALS — BP 108/66 | HR 85 | Ht 66.0 in | Wt 202.0 lb

## 2022-01-12 DIAGNOSIS — Z0000 Encounter for general adult medical examination without abnormal findings: Secondary | ICD-10-CM | POA: Diagnosis not present

## 2022-01-12 DIAGNOSIS — L732 Hidradenitis suppurativa: Secondary | ICD-10-CM | POA: Diagnosis not present

## 2022-01-12 DIAGNOSIS — J4540 Moderate persistent asthma, uncomplicated: Secondary | ICD-10-CM

## 2022-01-12 DIAGNOSIS — Z86711 Personal history of pulmonary embolism: Secondary | ICD-10-CM | POA: Diagnosis not present

## 2022-01-12 MED ORDER — ALBUTEROL SULFATE HFA 108 (90 BASE) MCG/ACT IN AERS
2.0000 | INHALATION_SPRAY | Freq: Four times a day (QID) | RESPIRATORY_TRACT | 11 refills | Status: AC | PRN
  Filled 2022-01-12: qty 13.4, 50d supply, fill #0

## 2022-01-12 MED ORDER — DOXYCYCLINE HYCLATE 100 MG PO TABS
100.0000 mg | ORAL_TABLET | Freq: Two times a day (BID) | ORAL | 0 refills | Status: DC
  Filled 2022-01-12: qty 20, 10d supply, fill #0

## 2022-01-12 MED ORDER — CLINDAMYCIN PHOS-BENZOYL PEROX 1.2-5 % EX GEL
Freq: Two times a day (BID) | CUTANEOUS | 3 refills | Status: DC
  Filled 2022-01-12: qty 45, 14d supply, fill #0

## 2022-01-12 NOTE — Progress Notes (Signed)
Tollie Eth, DNP, AGNP-c Primary Care & Sports Medicine [address]  [address] [place], [place] [address] [phone]  New patient visit   Patient: Alicia Cardenas   DOB: 02-16-1981   41 y.o. Female  MRN: [id number] Visit Date: 01/12/2022  Patient Care Team: Tollie Eth, NP as PCP - General (Nurse Practitioner)  Today's Vitals   01/12/22 1454  BP: 108/66  Pulse: 85  SpO2: 99%  Weight: 202 lb (91.6 kg)  Height: 5\' 6"  (1.676 m)   Body mass index is 32.6 kg/m.   Today's healthcare provider: , NP   Chief Complaint  Patient presents with   New Patient (Initial Visit)    Patient presents today to establish care.    Subjective    Alicia Cardenas is a 41 y.o. female who presents today as a new patient to establish care.    Patient endorses the following concerns presently:  HS Significant concerns with ongoing painful sores in axilla. She endorses embarrassment over the drainage and odor. Would like to know if there is anything that can be done.   Pulmonary embolism at age 86 No recurrent symptoms or concerns.   History reviewed and reveals the following: Past Medical History:  Diagnosis Date   Anemia    Asthma    Diagnosed this pregnancy; hasn't used inhaler since Fall 2015   Asthma, chronic 03/30/2014   History of asthma 12/23/2013   Inhaler   History of embolism 02/14/2022   history of PE 02/16/2022 after prior pregnancy - rec get records from and refer to MFM for recs re anticoagulation during this pregnancy. Recs prophylactic lovenox during pregnancy and 6wks PP.   PE (pulmonary embolism)    Sickle cell trait (HCC)    Past Surgical History:  Procedure Laterality Date   NO PAST SURGERIES     TUBAL LIGATION Bilateral 07/05/2014   Procedure: POST PARTUM TUBAL LIGATION;  Surgeon: 07/07/2014, DO;  Location: [facility];  Service: Gynecology;  Laterality: Bilateral;   Family Status  Relation Name Status   Mother  Alive    Father  Alive   Mat Uncle  (Not Specified)   Neg Hx  (Not Specified)   Family History  Problem Relation Age of Onset   Diabetes Maternal Uncle    Heart disease Maternal Uncle    Hypertension Maternal Uncle    Hearing loss Neg Hx    Social History   Socioeconomic History   Marital status: Single    Spouse name: Not on file   Number of children: Not on file   Years of education: Not on file   Highest education level: Not on file  Occupational History   Not on file  Tobacco Use   Smoking status: Never   Smokeless tobacco: Never  Vaping Use   Vaping Use: Never used  Substance and Sexual Activity   Alcohol use: No   Drug use: No   Sexual activity: Yes    Birth control/protection: None  Other Topics Concern   Not on file  Social History Narrative   Not on file   Social Determinants of Health   Financial Resource Strain: Not on file  Food Insecurity: Not on file  Transportation Needs: Not on file  Physical Activity: Not on file  Stress: Not on file  Social Connections: Not on file   Outpatient Medications Prior to Visit  Medication Sig   [DISCONTINUED] albuterol (PROVENTIL HFA;VENTOLIN HFA) 108 (90 BASE)  MCG/ACT inhaler Inhale 1 puff into the lungs every 6 (six) hours as needed for wheezing or shortness of breath.   [DISCONTINUED] fluconazole (DIFLUCAN) 150 MG tablet Take 1 tablet (150 mg total) by mouth daily. May repeat in 72 hours if needed (Patient not taking: Reported on 01/12/2022)   No facility-administered medications prior to visit.   Allergies  Allergen Reactions   Penicillins Other (See Comments)    Has patient had a PCN reaction causing immediate rash, facial/tongue/throat swelling, SOB or lightheadedness with hypotension: Unknown Has patient had a PCN reaction causing severe rash involving mucus membranes or skin necrosis: Unknown Has patient had a PCN reaction that required hospitalization: Unknown Has patient had a PCN reaction occurring within the  last 10 years: Unknown  If all of the above answers are "NO", then may proceed with Cephalosporin use.    Immunization History  Administered Date(s) Administered   Tdap 02/18/2014    Review of Systems All review of systems negative except what is listed in the HPI   Objective    BP 108/66   Pulse 85   Ht 5\' 6"  (1.676 m)   Wt 202 lb (91.6 kg)   SpO2 99%   BMI 32.60 kg/m  Physical Exam Vitals and nursing note reviewed.  Constitutional:      General: She is not in acute distress.    Appearance: Normal appearance.  Eyes:     Extraocular Movements: Extraocular movements intact.     Conjunctiva/sclera: Conjunctivae normal.     Pupils: Pupils are equal, round, and reactive to light.  Neck:     Vascular: No carotid bruit.  Cardiovascular:     Rate and Rhythm: Normal rate and regular rhythm.     Pulses: Normal pulses.     Heart sounds: Normal heart sounds. No murmur heard. Pulmonary:     Effort: Pulmonary effort is normal.     Breath sounds: Normal breath sounds. No wheezing.  Abdominal:     General: Bowel sounds are normal.     Palpations: Abdomen is soft.  Musculoskeletal:        General: Normal range of motion.     Cervical back: Normal range of motion.     Right lower leg: No edema.     Left lower leg: No edema.  Skin:    General: Skin is warm and dry.     Capillary Refill: Capillary refill takes less than 2 seconds.     Findings: Abscess and rash present. Rash is pustular.     Comments: Pustular tracking abscess to bilateral axillary region consistent with hydradenitis.   Neurological:     General: No focal deficit present.     Mental Status: She is alert and oriented to person, place, and time.  Psychiatric:        Mood and Affect: Mood normal.        Behavior: Behavior normal.        Thought Content: Thought content normal.        Judgment: Judgment normal.     No results found for any visits on 01/12/22.  Assessment & Plan      Problem List Items  Addressed This Visit     Personal history of pulmonary embolism    During pregnancy.  No alarm symptoms present at this time.  Lungs clear to auscultation.  Patient is aware to contact emergency services if she begins to have any new or worsening symptoms.      Relevant Orders  D-Dimer, Quantitative   Hidradenitis suppurativa - Primary    Chronic.  Extensive scarring and tracking noted bilaterally in the axillary region. Verbal consent obtained and  Incision and drainage performed today with NS flushing followed by 0.5cc injection of triamcinolone into lesions in each axilla.  We will also send oral doxycycline and topical clindamycin gel to see if this is helpful in relieving some of the extensive symptoms are present.  Recommend follow-up in approximately 6 weeks to monitor.      Relevant Medications   doxycycline (VIBRA-TABS) 100 MG tablet   Clindamycin-Benzoyl Per, Refr, gel   Moderate persistent asthma without complication    Chronic.  Well-controlled with albuterol inhaler.  Refill provided today.  No alarm symptoms present at this time.  Follow-up if symptoms worsen or fail to improve.      Relevant Medications   albuterol (VENTOLIN HFA) 108 (90 Base) MCG/ACT inhaler   Other Relevant Orders   D-Dimer, Quantitative   Other Visit Diagnoses     Health maintenance examination       Relevant Orders   D-Dimer, Quantitative   CBC With Diff/Platelet   Comprehensive metabolic panel   Thyroid Panel With TSH   Hemoglobin A1c   VITAMIN D 25 Hydroxy (Vit-D Deficiency, Fractures)        Return for 6-8 weeks for follow-up HS.      Kham Zuckerman, Sung Amabile, NP, DNP, AGNP-C Primary Care [facility] Sports Medicine at [facility]

## 2022-01-12 NOTE — Patient Instructions (Signed)
Thank you for choosing [facility] for your Primary Care needs. I am excited for the opportunity to partner with you to meet your health care goals. It was a pleasure meeting you today!  Information on diet, exercise, and health maintenance recommendations are listed below. This is information to help you be sure you are on track for optimal health and monitoring.   Please look over this and let us know if you have any questions or if you have completed any of the health maintenance outside of [facility] so that we can be sure your records are up to date.  ___________________________________________________________ About Me: I am an Adult-Geriatric Nurse Practitioner with a background in caring for patients for more than 20 years with a strong intensive care background. I provide primary care and sports medicine services to patients age 13 and older within this office. My education had a strong focus on caring for the older adult population, which I am passionate about. I am also the director of the APP Fellowship with [employer].   My desire is to provide you with the best service through preventive medicine and supportive care. I consider you a part of the medical team and value your input. I work diligently to ensure that you are heard and your needs are met in a safe and effective manner. I want you to feel comfortable with me as your provider and want you to know that your health concerns are important to me.  For your information, our office hours are: Monday, Tuesday, and Thursday 8:00 AM - 5:00 PM Wednesday and Friday 8:00 AM - 12:00 PM.   In my time away from the office I am teaching new APP's within the system and am unavailable, but my partner, Dr. deCuba is in the office for emergent needs.   If you have questions or concerns, please call our office at [phone] or send us a MyChart message and we will respond as quickly as possible.   ____________________________________________________________ MyChart:  For all urgent or time sensitive needs we ask that you please call the office to avoid delays. Our number is [phone]. MyChart is not constantly monitored and due to the large volume of messages a day, replies may take up to 72 business hours.  MyChart Policy: MyChart allows for you to see your visit notes, after visit summary, provider recommendations, lab and tests results, make an appointment, request refills, and contact your provider or the office for non-urgent questions or concerns. Providers are seeing patients during normal business hours and do not have built in time to review MyChart messages.  We ask that you allow a minimum of 3 business days for responses to MyChart messages. For this reason, please do not send urgent requests through MyChart. Please call the office at [phone]. New and ongoing conditions may require a visit. We have virtual and in person visit available for your convenience.  Complex MyChart concerns may require a visit. Your provider may request you schedule a virtual or in person visit to ensure we are providing the best care possible. MyChart messages sent after 11:00 AM on Friday will not be received by the provider until Monday morning.    Lab and Test Results: You will receive your lab and test results on MyChart as soon as they are completed and results have been sent by the lab or testing facility. Due to this service, you will receive your results BEFORE your provider.  I review   lab and tests results each morning prior to seeing patients. Some results require collaboration with other providers to ensure you are receiving the most appropriate care. For this reason, we ask that you please allow a minimum of 3-5 business days from the time the ALL results have been received for your provider to receive and review lab and test results and contact you about these.  Most lab and test  result comments from the provider will be sent through MyChart. Your provider may recommend changes to the plan of care, follow-up visits, repeat testing, ask questions, or request an office visit to discuss these results. You may reply directly to this message or call the office at [phone] to provide information for the provider or set up an appointment. In some instances, you will be called with test results and recommendations. Please let us know if this is preferred and we will make note of this in your chart to provide this for you.    If you have not heard a response to your lab or test results in 5 business days from all results returning to MyChart, please call the office to let us know. We ask that you please avoid calling prior to this time unless there is an emergent concern. Due to high call volumes, this can delay the resulting process.  After Hours: For all non-emergency after hours needs, please call the office at [phone] and select the option to reach the on-call provider service. On-call services are shared between multiple [facility] offices and therefore it will not be possible to speak directly with your provider. On-call providers may provide medical advice and recommendations, but are unable to provide refills for maintenance medications.  For all emergency or urgent medical needs after normal business hours, we recommend that you seek care at the closest Urgent Care or Emergency Department to ensure appropriate treatment in a timely manner.  [facility] has a 24 hour emergency room located on the ground floor for your convenience.   Urgent Concerns During the Business Day Providers are seeing patients from 8AM to 5PM with a busy schedule and are most often not able to respond to non-urgent calls until the end of the day or the next business day. If you should have URGENT concerns during the day, please call and speak to the nurse or schedule a same  day appointment so that we can address your concern without delay.   Thank you, again, for choosing me as your health care partner. I appreciate your trust and look forward to learning more about you.   Alicia Avereigh Spainhower, DNP, AGNP-c ___________________________________________________________  Health Maintenance Recommendations Screening Testing Mammogram Every 1 -2 years based on history and risk factors Starting at age 40 Pap Smear Ages 21-39 every 3 years Ages 30-65 every 5 years with HPV testing More frequent testing may be required based on results and history Colon Cancer Screening Every 1-10 years based on test performed, risk factors, and history Starting at age 45 Bone Density Screening Every 2-10 years based on history Starting at age 65 for women Recommendations for men differ based on medication usage, history, and risk factors AAA Screening One time ultrasound Men 65-75 years old who have every smoked Lung Cancer Screening Low Dose Lung CT every 12 months Age 55-80 years with a 30 pack-year smoking history who still smoke or who have quit within the last 15 years  Screening Labs Routine  Labs: Complete Blood Count (CBC), Complete Metabolic Panel (  CMP), Cholesterol (Lipid Panel) Every 6-12 months based on history and medications May be recommended more frequently based on current conditions or previous results Hemoglobin A1c Lab Every 3-12 months based on history and previous results Starting at age 45 or earlier with diagnosis of diabetes, high cholesterol, BMI >26, and/or risk factors Frequent monitoring for patients with diabetes to ensure blood sugar control Thyroid Panel (TSH w/ T3 & T4) Every 6 months based on history, symptoms, and risk factors May be repeated more often if on medication HIV One time testing for all patients 13 and older May be repeated more frequently for patients with increased risk factors or exposure Hepatitis C One time testing for  all patients 18 and older May be repeated more frequently for patients with increased risk factors or exposure Gonorrhea, Chlamydia Every 12 months for all sexually active persons 13-24 years Additional monitoring may be recommended for those who are considered high risk or who have symptoms PSA Men 40-54 years old with risk factors Additional screening may be recommended from age 55-69 based on risk factors, symptoms, and history  Vaccine Recommendations Tetanus Booster All adults every 10 years Flu Vaccine All patients 6 months and older every year COVID Vaccine All patients 12 years and older Initial dosing with booster May recommend additional booster based on age and health history HPV Vaccine 2 doses all patients age 9-26 Dosing may be considered for patients over 26 Shingles Vaccine (Shingrix) 2 doses all adults 55 years and older Pneumonia (Pneumovax 23) All adults 65 years and older May recommend earlier dosing based on health history Pneumonia (Prevnar 13) All adults 65 years and older Dosed 1 year after Pneumovax 23  Additional Screening, Testing, and Vaccinations may be recommended on an individualized basis based on family history, health history, risk factors, and/or exposure.  __________________________________________________________  Diet Recommendations for All Patients  I recommend that all patients maintain a diet low in saturated fats, carbohydrates, and cholesterol. While this can be challenging at first, it is not impossible and small changes can make big differences.  Things to try: Decreasing the amount of soda, sweet tea, and/or juice to one or less per day and replace with water While water is always the first choice, if you do not like water you may consider adding a water additive without sugar to improve the taste other sugar free drinks Replace potatoes with a brightly colored vegetable at dinner Use healthy oils, such as canola oil or olive  oil, instead of butter or hard margarine Limit your bread intake to two pieces or less a day Replace regular pasta with low carb pasta options Bake, broil, or grill foods instead of frying Monitor portion sizes  Eat smaller, more frequent meals throughout the day instead of large meals  An important thing to remember is, if you love foods that are not great for your health, you don't have to give them up completely. Instead, allow these foods to be a reward when you have done well. Allowing yourself to still have special treats every once in a while is a nice way to tell yourself thank you for working hard to keep yourself healthy.   Also remember that every day is a new day. If you have a bad day and "fall off the wagon", you can still climb right back up and keep moving along on your journey!  We have resources available to help you!  Some websites that may be helpful include: www.MyPlate.gov  Www.VeryWellFit.com _____________________________________________________________    Activity Recommendations for All Patients  I recommend that all adults get at least 20 minutes of moderate physical activity that elevates your heart rate at least 5 days out of the week.  Some examples include: Walking or jogging at a pace that allows you to carry on a conversation Cycling (stationary bike or outdoors) Water aerobics Yoga Weight lifting Dancing If physical limitations prevent you from putting stress on your joints, exercise in a pool or seated in a chair are excellent options.  Do determine your MAXIMUM heart rate for activity: YOUR AGE - 220 = MAX HeartRate   Remember! Do not push yourself too hard.  Start slowly and build up your pace, speed, weight, time in exercise, etc.  Allow your body to rest between exercise and get good sleep. You will need more water than normal when you are exerting yourself. Do not wait until you are thirsty to drink. Drink with a purpose of getting in at least  8, 8 ounce glasses of water a day plus more depending on how much you exercise and sweat.    If you begin to develop dizziness, chest pain, abdominal pain, jaw pain, shortness of breath, headache, vision changes, lightheadedness, or other concerning symptoms, stop the activity and allow your body to rest. If your symptoms are severe, seek emergency evaluation immediately. If your symptoms are concerning, but not severe, please let us know so that we can recommend further evaluation.     

## 2022-02-14 ENCOUNTER — Encounter (HOSPITAL_BASED_OUTPATIENT_CLINIC_OR_DEPARTMENT_OTHER): Payer: Self-pay | Admitting: Nurse Practitioner

## 2022-02-14 DIAGNOSIS — Z86718 Personal history of other venous thrombosis and embolism: Secondary | ICD-10-CM

## 2022-02-14 DIAGNOSIS — L732 Hidradenitis suppurativa: Secondary | ICD-10-CM | POA: Insufficient documentation

## 2022-02-14 DIAGNOSIS — J4540 Moderate persistent asthma, uncomplicated: Secondary | ICD-10-CM | POA: Insufficient documentation

## 2022-02-14 HISTORY — DX: Personal history of other venous thrombosis and embolism: Z86.718

## 2022-02-14 NOTE — Assessment & Plan Note (Addendum)
Chronic.  Extensive scarring and tracking noted bilaterally in the axillary region. Verbal consent obtained and  Incision and drainage performed today with NS flushing followed by 0.5cc injection of triamcinolone into lesions in each axilla.  We will also send oral doxycycline and topical clindamycin gel to see if this is helpful in relieving some of the extensive symptoms are present.  Recommend follow-up in approximately 6 weeks to monitor.

## 2022-02-14 NOTE — Assessment & Plan Note (Signed)
During pregnancy.  No alarm symptoms present at this time.  Lungs clear to auscultation.  Patient is aware to contact emergency services if she begins to have any new or worsening symptoms.

## 2022-02-14 NOTE — Assessment & Plan Note (Signed)
Chronic.  Well-controlled with albuterol inhaler.  Refill provided today.  No alarm symptoms present at this time.  Follow-up if symptoms worsen or fail to improve.

## 2023-05-03 ENCOUNTER — Other Ambulatory Visit (HOSPITAL_BASED_OUTPATIENT_CLINIC_OR_DEPARTMENT_OTHER): Payer: Self-pay | Admitting: Family Medicine

## 2023-05-03 ENCOUNTER — Ambulatory Visit (HOSPITAL_BASED_OUTPATIENT_CLINIC_OR_DEPARTMENT_OTHER): Payer: BC Managed Care – PPO | Admitting: Family Medicine

## 2023-05-03 ENCOUNTER — Encounter (HOSPITAL_BASED_OUTPATIENT_CLINIC_OR_DEPARTMENT_OTHER): Payer: Self-pay | Admitting: Family Medicine

## 2023-05-03 VITALS — BP 108/70 | Ht 66.0 in | Wt 196.0 lb

## 2023-05-03 DIAGNOSIS — L732 Hidradenitis suppurativa: Secondary | ICD-10-CM

## 2023-05-03 DIAGNOSIS — N6315 Unspecified lump in the right breast, overlapping quadrants: Secondary | ICD-10-CM

## 2023-05-03 NOTE — Patient Instructions (Signed)

## 2023-05-03 NOTE — Progress Notes (Signed)
Acute Office Visit  Subjective:     Patient ID: Natia Huyck, female    DOB: 1980-12-04, 42 y.o.   MRN: [id number]  Chief Complaint  Patient presents with   Breast Problem    Right breast, noticed about 3 days, painful, warm to the touch, feels like it has gotten larger in the past 3 days or so   Dejanae Neyra is a 42 year old female patient who presents today for an acute visit with complaint of right breast lump that started about 3 days ago. She reports it is warm to the touch and painful.  She does have HS but reports she never had a lump present in this location.   Symptoms have been present: for about 3 days  Denies nipple discharge, nipple inversion, skin texture changes, fever/chills.   Associated symptoms include: warm, feels "like a knot"  Treatments tried include: none, underneath her breast- difficult area to place anything topically   ROS: see HPI     Objective:    BP 108/70   Ht 5\' 6"  (1.676 m)   Wt 196 lb (88.9 kg)   BMI 31.64 kg/m   Physical Exam Vitals reviewed. Exam conducted with a chaperone present.  Constitutional:      Appearance: Normal appearance.  Cardiovascular:     Rate and Rhythm: Normal rate and regular rhythm.     Heart sounds: Normal heart sounds.  Pulmonary:     Effort: Pulmonary effort is normal.     Breath sounds: Normal breath sounds.  Chest:     Chest wall: Mass present.  Breasts:    Tanner Score is 5.     Right: Mass and tenderness present. No swelling, bleeding, inverted nipple, nipple discharge or skin change.     Left: Normal.       Comments: Small mass at 6 o'clock position of R breast that is 2mm x 1mm Musculoskeletal:     Right upper arm: Swelling present.     Left upper arm: Swelling present.     Comments: HS lesions present in both right and left axilla   Lymphadenopathy:     Upper Body:     Right upper body: No supraclavicular, axillary or pectoral adenopathy.     Left upper body: No supraclavicular, axillary or  pectoral adenopathy.  Neurological:     Mental Status: She is alert.  Psychiatric:        Mood and Affect: Mood normal.        Behavior: Behavior normal.       Assessment & Plan:   1. Breast lump on right side at 6 o'clock position (Primary) Patient is a pleasant 42 year old female patient who presents today regarding concerns about a right painful lump under her right breast that she noticed a few days ago. Denies changes to skin texture, nipple inversion, nipple discharge. She reports feeling a hard mass that is tender to palpation. Jolyne Loa, CMA present as chaperone for physical exam. Physical exam with noticeable HS lesions present in bilateral axillae. Left breast normal with no mass, lumps, or tenderness present. Right breast with hard, non-moveable tender mass present at the 6 o'clock position. No erythema present. No skin changes or orange-peel texture, no warmth present. Will obtain STAT right breast US and update patient with results and further plan of care.  - US BREAST COMPLETE UNI RIGHT INC AXILLA; Future  2. Hidradenitis suppurativa Patient has a history of HS with current exacerbation. She reports that oral doxycycline and topical  clindamycin cream does not help improve this. She would like a referral to dermatology (picture added to chart).  - Ambulatory referral to Dermatology   Return for she can schedule her physical in a few weeks with me if she wants.  Alyson Reedy, FNP

## 2023-06-06 ENCOUNTER — Other Ambulatory Visit: Payer: BC Managed Care – PPO

## 2023-06-21 ENCOUNTER — Other Ambulatory Visit (HOSPITAL_BASED_OUTPATIENT_CLINIC_OR_DEPARTMENT_OTHER): Payer: Self-pay | Admitting: Family Medicine

## 2023-06-21 ENCOUNTER — Encounter (HOSPITAL_BASED_OUTPATIENT_CLINIC_OR_DEPARTMENT_OTHER): Payer: Self-pay | Admitting: Family Medicine

## 2023-06-21 ENCOUNTER — Ambulatory Visit
Admission: RE | Admit: 2023-06-21 | Discharge: 2023-06-21 | Disposition: A | Discharge status: Home / Self Care | Payer: BC Managed Care – PPO | Source: Ambulatory Visit | Attending: Family Medicine | Admitting: Family Medicine

## 2023-06-21 DIAGNOSIS — N6315 Unspecified lump in the right breast, overlapping quadrants: Secondary | ICD-10-CM

## 2023-06-21 DIAGNOSIS — N6310 Unspecified lump in the right breast, unspecified quadrant: Secondary | ICD-10-CM

## 2023-06-22 ENCOUNTER — Encounter: Payer: Self-pay | Admitting: Family Medicine

## 2023-06-26 ENCOUNTER — Ambulatory Visit: Payer: BC Managed Care – PPO | Admitting: Internal Medicine

## 2023-06-27 ENCOUNTER — Ambulatory Visit
Admission: RE | Admit: 2023-06-27 | Discharge: 2023-06-27 | Disposition: A | Discharge status: Home / Self Care | Payer: BC Managed Care – PPO | Source: Ambulatory Visit | Attending: Family Medicine | Admitting: Family Medicine

## 2023-06-27 ENCOUNTER — Encounter: Payer: Self-pay | Admitting: Family Medicine

## 2023-06-27 DIAGNOSIS — N6001 Solitary cyst of right breast: Secondary | ICD-10-CM | POA: Diagnosis not present

## 2023-06-27 DIAGNOSIS — N6310 Unspecified lump in the right breast, unspecified quadrant: Secondary | ICD-10-CM

## 2023-09-14 ENCOUNTER — Encounter (HOSPITAL_BASED_OUTPATIENT_CLINIC_OR_DEPARTMENT_OTHER): Payer: Self-pay

## 2023-10-10 ENCOUNTER — Ambulatory Visit: Admitting: Physician Assistant

## 2023-10-23 ENCOUNTER — Other Ambulatory Visit (HOSPITAL_BASED_OUTPATIENT_CLINIC_OR_DEPARTMENT_OTHER): Payer: Self-pay | Admitting: Nurse Practitioner

## 2023-10-23 DIAGNOSIS — J4540 Moderate persistent asthma, uncomplicated: Secondary | ICD-10-CM

## 2023-11-08 ENCOUNTER — Other Ambulatory Visit (HOSPITAL_COMMUNITY): Payer: Self-pay

## 2023-11-11 ENCOUNTER — Other Ambulatory Visit (HOSPITAL_BASED_OUTPATIENT_CLINIC_OR_DEPARTMENT_OTHER): Payer: Self-pay | Admitting: Nurse Practitioner

## 2023-11-11 DIAGNOSIS — J4540 Moderate persistent asthma, uncomplicated: Secondary | ICD-10-CM

## 2023-11-21 ENCOUNTER — Other Ambulatory Visit (HOSPITAL_COMMUNITY): Payer: Self-pay

## 2023-12-13 DIAGNOSIS — L02412 Cutaneous abscess of left axilla: Secondary | ICD-10-CM | POA: Diagnosis not present

## 2023-12-13 DIAGNOSIS — R110 Nausea: Secondary | ICD-10-CM | POA: Diagnosis not present

## 2024-04-24 ENCOUNTER — Ambulatory Visit: Payer: Self-pay

## 2024-04-24 NOTE — Telephone Encounter (Signed)
 FYI Only or Action Required?: FYI only for provider: appointment scheduled on 05/05/24.  Patient was last seen in primary care on 05/03/2023 by Towana Small, FNP.  Called Nurse Triage reporting Wheezing.  Symptoms began worsening x1-2 months.  Interventions attempted: Prescription medications: albuterol  rescue inhaler 2 puffs 4-5 times a day.  Symptoms are: gradually worsening.  Triage Disposition: See Within 2 Weeks in Office (overriding See Physician Within 24 Hours)  Patient/caregiver understands and will follow disposition?: Yes             Copied from CRM #[id number]. Topic: Clinical - Red Word Triage >> Apr 24, 2024 11:22 AM Alicia Cardenas wrote: Red Word that prompted transfer to Nurse Triage: Pt called in to establish care and begin routine blood work. Pt states she needs a new asthma pump, breathing has gotten worse. Energy levels are down. Transferred to NT Reason for Disposition  Coughing up rusty-colored or yellow-green sputum  Answer Assessment - Initial Assessment Questions 1. RESPIRATORY STATUS: Describe your breathing? (e.g., wheezing, shortness of breath, unable to speak, severe coughing)      Lungs tight, wheezing, heavy feeling.  2. ONSET: When did this asthma attack begin?      Worsening x 1-2 months.  3. TRIGGER: What do you think triggered this attack? (e.g., URI, exposure to pollen or other allergen, tobacco smoke)      Having to walking up the stairs, strong smells like perfumes, steamy showers.  4. PEAK EXPIRATORY FLOW RATE (PEFR): Do you use a peak flow meter? If Yes, ask: What's the current peak flow? What's your personal best peak flow?      No.  5. SEVERITY: How bad is this attack?      Currently no SOB at rest. No wheezing or labored breathing noted.  6. ASTHMA MEDICINES:  What treatments have you tried?      Albuterol  inhaler.  7. INHALED QUICK-RELIEF TREATMENTS FOR THIS ATTACK: What treatments have you given yourself so  far? and How many and how often? If using an inhaler, ask, How many puffs? Note: Routine treatments are 2 puffs every 4 hours as needed. Rescue treatments are 4 puffs repeated every 20 minutes, up to three times as needed.      Albuterol  rescue inhaler having to use it 2 puffs x 4-5 times daily.  8. OTHER SYMPTOMS: Do you have any other symptoms? (e.g., chest pain, coughing up yellow sputum, fever, runny nose)     Coughing up sputum in the morning yellow colored (sometimes thicker and with blood). Fatigue. No fever.  9. O2 SATURATION MONITOR:  Do you use an oxygen saturation monitor (pulse oximeter) at home? If Yes, What is your reading (oxygen level) today? What is your usual oxygen saturation reading? (e.g., 95%)     No.  10. PREGNANCY: Is there any chance you are pregnant? When was your last menstrual period?       LMP: 04/01/24.  Protocols used: Asthma Attack-A-AH

## 2024-05-05 ENCOUNTER — Ambulatory Visit (HOSPITAL_BASED_OUTPATIENT_CLINIC_OR_DEPARTMENT_OTHER)

## 2024-05-05 ENCOUNTER — Encounter (HOSPITAL_BASED_OUTPATIENT_CLINIC_OR_DEPARTMENT_OTHER): Payer: Self-pay

## 2024-05-05 VITALS — BP 115/70 | HR 82 | Ht 66.0 in | Wt 195.6 lb

## 2024-05-05 DIAGNOSIS — J4540 Moderate persistent asthma, uncomplicated: Secondary | ICD-10-CM | POA: Diagnosis not present

## 2024-05-05 DIAGNOSIS — Z7689 Persons encountering health services in other specified circumstances: Secondary | ICD-10-CM | POA: Diagnosis not present

## 2024-05-05 DIAGNOSIS — R200 Anesthesia of skin: Secondary | ICD-10-CM

## 2024-05-05 MED ORDER — FLUTICASONE-SALMETEROL 100-50 MCG/ACT IN AEPB: 1.0000 | INHALATION_SPRAY | Freq: Two times a day (BID) | RESPIRATORY_TRACT | 3 refills | Status: AC

## 2024-05-05 NOTE — Patient Instructions (Signed)
 I would like you to pick up a thumb spika splint and wear that day and night for approximately for the next 3 days to see if symptoms resolve in your fingers.   I have also attached cervical stretches for you to go over to see if that also helps resolution of your symptoms.

## 2024-05-05 NOTE — Progress Notes (Signed)
 New Patient Office Visit  Subjective:   Alicia Cardenas [birth year] 05/05/2024  Chief Complaint  Patient presents with   Asthma    Patient has been having tingling in LT pointer and middle finger for about a week. States when she's walking she gets SOB. She has some concerns about weight loss. Denies any other concerns for today's visit   HPI: [place] presents today to establish care at [facility] at [facility]. Introduced to publishing rights manager role and practice setting with verbalized understanding by patient.  All questions answered.   Last PCP: unknown Last annual physical: unknown Concerns: See below   ASTHMA: Pt states her asthma symptoms have become more frequent. States when she encounters very strong smells or does something to exert herself she states she has to take more deep breaths and feels like her lungs are tighter than normal. States when she uses her rescue inhaler she feels she has to use it approximately 3-4 times for her symptoms to resolve. States she has to use her rescue inhaler every other day.   Neuropathy of Left Hand Pointer/Middle Fingers: States symptoms started last week. Believes it to have started after she had bent over for something and stood back up quicker than normal. States just her pointer and middle finger that are numb. Denies any extension of pain/numbness from her shoulder or elbow. States it is localized to those fingers. Denies any wrist pain. Has not taken any medication for symptoms.   The following portions of the patient's history were reviewed and updated as appropriate: past medical history, past surgical history, family history, social history, allergies, medications, and problem list.   Patient Active Problem List   Diagnosis Date Noted   Morbid obesity (HCC) 02/14/2022   Hidradenitis suppurativa 02/14/2022   Moderate persistent asthma without complication 02/14/2022   Penicillin  allergy 03/30/2014   Personal history of pulmonary embolism 12/30/2013   History of deep vein thrombosis 05/22/2008   Past Medical History:  Diagnosis Date   Anemia    Asthma    Diagnosed this pregnancy; hasn't used inhaler since Fall 2015   Asthma, chronic 03/30/2014   History of asthma 12/23/2013   Inhaler   History of embolism 02/14/2022   history of PE after prior pregnancy - rec get records from [place] and refer to MFM for recs re anticoagulation during this pregnancy. Recs prophylactic lovenox  during pregnancy and 6wks PP.   PE (pulmonary embolism)    Sickle cell trait    Past Surgical History:  Procedure Laterality Date   NO PAST SURGERIES     TUBAL LIGATION Bilateral 07/05/2014   Procedure: POST PARTUM TUBAL LIGATION;  Surgeon: Delon CHRISTELLA Prude, DO;  Location: [facility];  Service: Gynecology;  Laterality: Bilateral;   Family History  Problem Relation Age of Onset   Breast cancer Maternal Grandmother    Diabetes Maternal Uncle    Heart disease Maternal Uncle    Hypertension Maternal Uncle    Hearing loss Neg Hx    Social History   Socioeconomic History   Marital status: Significant Other    Spouse name: Not on file   Number of children: Not on file   Years of education: Not on file   Highest education level: Not on file  Occupational History   Not on file  Tobacco Use   Smoking status: Never   Smokeless tobacco: Never  Vaping Use   Vaping status: Never Used  Substance and  Sexual Activity   Alcohol use: No   Drug use: No   Sexual activity: Yes    Birth control/protection: None  Other Topics Concern   Not on file  Social History Narrative   Not on file   Social Drivers of Health   Tobacco Use: Low Risk (05/05/2024)   Patient History    Smoking Tobacco Use: Never    Smokeless Tobacco Use: Never    Passive Exposure: Not on file  Financial Resource Strain: Low Risk (05/05/2024)   Overall Financial Resource Strain (CARDIA)    Difficulty of Paying Living  Expenses: Not hard at all  Food Insecurity: No Food Insecurity (05/05/2024)   Epic    Worried About Radiation Protection Practitioner of Food in the Last Year: Never true    Ran Out of Food in the Last Year: Never true  Transportation Needs: No Transportation Needs (05/05/2024)   Epic    Lack of Transportation (Medical): No    Lack of Transportation (Non-Medical): No  Physical Activity: Insufficiently Active (05/05/2024)   Exercise Vital Sign    Days of Exercise per Week: 1 day    Minutes of Exercise per Session: 20 min  Stress: Stress Concern Present (05/05/2024)   [employer] of Occupational Health - Occupational Stress Questionnaire    Feeling of Stress: To some extent  Social Connections: Moderately Integrated (05/05/2024)   Social Connection and Isolation Panel    Frequency of Communication with Friends and Family: Three times a week    Frequency of Social Gatherings with Friends and Family: Three times a week    Attends Religious Services: More than 4 times per year    Active Member of Clubs or Organizations: Yes    Attends Banker Meetings: More than 4 times per year    Marital Status: Divorced  Intimate Partner Violence: Not on file  Depression (PHQ2-9): Low Risk (05/05/2024)   Depression (PHQ2-9)    PHQ-2 Score: 0  Alcohol Screen: Low Risk (05/05/2024)   Alcohol Screen    Last Alcohol Screening Score (AUDIT): 0  Housing: Low Risk (05/05/2024)   Epic    Unable to Pay for Housing in the Last Year: No    Number of Times Moved in the Last Year: 0    Homeless in the Last Year: No  Utilities: Not At Risk (05/05/2024)   Epic    Threatened with loss of utilities: No  Health Literacy: Adequate Health Literacy (05/05/2024)   B1300 Health Literacy    Frequency of need for help with medical instructions: Never   Outpatient Medications Prior to Visit  Medication Sig Dispense Refill   albuterol  (VENTOLIN  HFA) 108 (90 Base) MCG/ACT inhaler Inhale 2 puffs into the lungs every 6  (six) hours as needed for wheezing. 13.4 g 11   No facility-administered medications prior to visit.   Allergies[1]  ROS: A complete ROS was performed with pertinent positives/negatives noted in the HPI. The remainder of the ROS are negative.   Objective:   Today's Vitals   05/05/24 1311  BP: 115/70  Pulse: 82  SpO2: 100%  Weight: 195 lb 9.6 oz (88.7 kg)  Height: 5' 6 (1.676 m)    GENERAL: Well-appearing, in NAD. Well nourished. RESPIRATORY: Chest wall symmetrical. Respirations even and non-labored. Breath sounds clear to auscultation bilaterally.  CARDIAC: S1, S2 present, regular rate and rhythm without murmur or gallops. Peripheral pulses 2+ bilaterally.  MSK: Muscle tone and strength appropriate for age. Joints w/o tenderness, redness, or swelling. Negative  Phalen's and Tinnel's.  EXTREMITIES: Without clubbing, cyanosis, or edema.  NEUROLOGIC: No motor or sensory deficits. Steady, even gait. C2-C12 intact. Sensation intact to left ring and middle finger.  PSYCH/MENTAL STATUS: Alert, oriented x 3. Cooperative, appropriate mood and affect.    Health Maintenance Due  Topic Date Due   Hepatitis C Screening  Never done   Pneumococcal Vaccine (1 of 2 - PCV) Never done   Hepatitis B Vaccines 19-59 Average Risk (1 of 3 - 19+ 3-dose series) Never done   HPV VACCINES (1 - 3-dose SCDM series) Never done   Cervical Cancer Screening (HPV/Pap Cotest)  Never done   COVID-19 Vaccine (1 - 2025-26 season) Never done   DTaP/Tdap/Td (2 - Td or Tdap) 02/19/2024    No results found for any visits on 05/05/24.     Assessment & Plan:  1. Encounter to establish care with new doctor (Primary) Discussed role of NP and expectations of the Primary Care Clinic. Discussed medical, surgical, and family history.   2. Moderate persistent asthma without complication Discussed adding a daily inhaler to patient's medication list to help decrease the need for her rescue inhaler. Discussed red flag  symptoms. Pt verbalized understanding. -Advair 100-50 twice daily.  3. Numbness of fingers Discussed the potential cause being a cervical radiculopathy versus carpal tunnel. Discussed buying a thumb spica splint to see if symptoms resolve. Discussed also doing cervical exercises to see if that helps resolve symptoms.    Patient to reach out to office if new, worrisome, or unresolved symptoms arise or if no improvement in patient's condition. Patient verbalized understanding and is agreeable to treatment plan. All questions answered to patient's satisfaction.    Return in about 3 months (around 08/03/2024) for annual physical (fasting labs).    Lauraine Almarie Angus DNP, FNP-C      [1]  Allergies Allergen Reactions   Penicillins Other (See Comments)    Has patient had a PCN reaction causing immediate rash, facial/tongue/throat swelling, SOB or lightheadedness with hypotension: Unknown Has patient had a PCN reaction causing severe rash involving mucus membranes or skin necrosis: Unknown Has patient had a PCN reaction that required hospitalization: Unknown Has patient had a PCN reaction occurring within the last 10 years: Unknown  If all of the above answers are NO, then may proceed with Cephalosporin use.

## 2024-05-09 NOTE — Addendum Note (Signed)
 Addended by: Jakya Dovidio ELIZABETH on: 05/09/2024 09:27 AM   Modules accepted: Orders, Level of Service

## 2024-07-28 ENCOUNTER — Ambulatory Visit: Admitting: Dermatology

## 2024-08-06 ENCOUNTER — Encounter (HOSPITAL_BASED_OUTPATIENT_CLINIC_OR_DEPARTMENT_OTHER)
